# Patient Record
Sex: Female | Born: 1955 | Hispanic: Yes | Smoking: Never smoker
Health system: Southern US, Community
[De-identification: ages and names within clinical notes are randomized; demographics above are authoritative.]

---

## 2019-03-15 ENCOUNTER — Encounter (HOSPITAL_COMMUNITY): Payer: Self-pay | Admitting: Emergency Medicine

## 2019-03-15 ENCOUNTER — Emergency Department (HOSPITAL_COMMUNITY)
Admission: EM | Admit: 2019-03-15 | Discharge: 2019-03-15 | Disposition: A | Discharge status: Home / Self Care | Payer: BLUE CROSS/BLUE SHIELD | Attending: Emergency Medicine | Admitting: Emergency Medicine

## 2019-03-15 DIAGNOSIS — R220 Localized swelling, mass and lump, head: Secondary | ICD-10-CM | POA: Insufficient documentation

## 2019-03-15 MED ORDER — NAPROXEN 500 MG PO TABS: 500.0000 mg | ORAL_TABLET | Freq: Two times a day (BID) | ORAL | 0 refills | Status: DC

## 2019-03-15 MED ORDER — AMOXICILLIN-POT CLAVULANATE 875-125 MG PO TABS: 1.0000 | ORAL_TABLET | Freq: Two times a day (BID) | ORAL | 0 refills | Status: DC

## 2019-03-15 NOTE — ED Provider Notes (Signed)
[facility] EMERGENCY DEPARTMENT Provider Note   CSN: [id number] Arrival date & time: 03/15/19  0759    History   Chief Complaint Chief Complaint  Patient presents with  . Facial Swelling    HPI Ebony Davis is a 63 y.o. female.     63 year old female with prior medical history as detailed below presents for evaluation of swelling of the right cheek.  Patient has noticed the swelling over the last 24 hours.  She reports complains of tenderness just inferior to the right eye and just lateral to the nose.  She denies fever.  She does report some vague pain to the right upper jaw into the her dentition.  She denies recent history of dental abscess or known dental disease.  She has not seen a dentist in quite some time.  She is otherwise without complaint.  She denies fever, chest pain, shortness of breath, nausea, vomiting, or other acute complaint.  History obtained with assistance of Engineer, structural.  The history is provided by the patient and medical records. The history is limited by a language barrier. A language interpreter was used.  Illness  Location:  Swelling and pain of the right cheek. Severity:  Mild Onset quality:  Gradual Duration:  1 day Timing:  Constant Progression:  Worsening Chronicity:  New Associated symptoms: no fever     History reviewed. No pertinent past medical history.  There are no active problems to display for this patient.   OB History   No obstetric history on file.      Home Medications    Prior to Admission medications   Not on File    Family History No family history on file.  Social History Social History   Tobacco Use  . Smoking status: Not on file  Substance Use Topics  . Alcohol use: Not on file  . Drug use: Not on file     Allergies   Patient has no known allergies.   Review of Systems Review of Systems  Constitutional: Negative for fever.  All other systems reviewed and are  negative.    Physical Exam Updated Vital Signs BP (!) 162/86 (BP Location: Right Arm)   Pulse (!) 101   Temp 98.1 F (36.7 C) (Oral)   Resp 16   SpO2 97%   Physical Exam Vitals signs and nursing note reviewed.  Constitutional:      General: She is not in acute distress.    Appearance: She is well-developed.  HENT:     Head: Normocephalic and atraumatic.     Mouth/Throat:     Comments: Intraoral exam is without demonstration of dental abscess or significant dental pathology. Eyes:     Conjunctiva/sclera: Conjunctivae normal.     Pupils: Pupils are equal, round, and reactive to light.  Neck:     Musculoskeletal: Normal range of motion and neck supple.  Cardiovascular:     Rate and Rhythm: Normal rate and regular rhythm.     Heart sounds: Normal heart sounds.  Pulmonary:     Effort: Pulmonary effort is normal. No respiratory distress.     Breath sounds: Normal breath sounds.  Abdominal:     General: There is no distension.     Palpations: Abdomen is soft.     Tenderness: There is no abdominal tenderness.  Musculoskeletal: Normal range of motion.        General: No deformity.  Skin:    General: Skin is warm and dry.  Comments: Tenderness and edema noted to the right cheek just inferior to the right eye and just lateral to the right nare.  No fluctuance noted.  No erythema noted.    Neurological:     Mental Status: She is alert and oriented to person, place, and time.      ED Treatments / Results  Labs (all labs ordered are listed, but only abnormal results are displayed) Labs Reviewed - No data to display  EKG None  Radiology No results found.  Procedures Procedures (including critical care time)  Medications Ordered in ED Medications - No data to display   Initial Impression / Assessment and Plan / ED Course  I have reviewed the triage vital signs and the nursing notes.  Pertinent labs & imaging results that were available during my care of the  patient were reviewed by me and considered in my medical decision making (see chart for details).        MDM  Screen complete  Velta AddisonCecilia Davis was evaluated in Emergency Department on 03/15/2019 for the symptoms described in the history of present illness. She was evaluated in the context of the global COVID-19 pandemic, which necessitated consideration that the patient might be at risk for infection with the SARS-CoV-2 virus that causes COVID-19. Institutional protocols and algorithms that pertain to the evaluation of patients at risk for COVID-19 are in a state of rapid change based on information released by regulatory bodies including the CDC and federal and state organizations. These policies and algorithms were followed during the patient's care in the ED.  Patient is presenting for evaluation of mild swelling and tenderness to the right cheek.  This is most likely secondary to a dental infection.  She is otherwise without complaint and appears to be appropriate for discharge.  We will prescribe a course of antibiotics.  She does understand the need for close follow-up with dentistry.  Strict return precautions given and understood.  Final Clinical Impressions(s) / ED Diagnoses   Final diagnoses:  Facial swelling    ED Discharge Orders         Ordered    amoxicillin-clavulanate (AUGMENTIN) 875-125 MG tablet  Every 12 hours     03/15/19 0834           Wynetta FinesMessick, Azad Calame C, MD 03/15/19 [phone]

## 2019-03-15 NOTE — ED Triage Notes (Signed)
Pt reports pain and swelling in right side of face. Denies known injury, insect bite or new medicine or products. Pt states right side of face is painful and itching

## 2019-03-15 NOTE — ED Notes (Signed)
ED Provider at bedside. 

## 2019-03-15 NOTE — Discharge Instructions (Signed)
Please return for any problem. Follow up with your regular care provider as instructed.   Follow up with Dentistry as instructed.

## 2020-01-17 ENCOUNTER — Ambulatory Visit: Payer: Medicare Other | Attending: Internal Medicine

## 2020-01-17 DIAGNOSIS — Z23 Encounter for immunization: Secondary | ICD-10-CM

## 2020-01-17 NOTE — Progress Notes (Signed)
   Covid-19 Vaccination Clinic  Name:  Ebony Davis    MRN: [id number] DOB: Oct 28, 1956  01/17/2020  [name] was observed post Covid-19 immunization for 15 minutes without incident. She was provided with Vaccine Information Sheet and instruction to access the V-Safe system.   [name] was instructed to call 911 with any severe reactions post vaccine: Marland Kitchen Difficulty breathing  . Swelling of face and throat  . A fast heartbeat  . A bad rash all over body  . Dizziness and weakness   Immunizations Administered    Name Date Dose VIS Date Route   Pfizer COVID-19 Vaccine 01/17/2020  8:17 AM 0.3 mL 10/18/2019 Intramuscular   Manufacturer: [employer], [employer]   Lot: [id number]   NDC: [address]-8

## 2020-02-10 ENCOUNTER — Ambulatory Visit: Payer: Medicare Other

## 2020-02-10 ENCOUNTER — Ambulatory Visit: Payer: Medicare Other | Attending: Internal Medicine

## 2020-02-10 DIAGNOSIS — Z23 Encounter for immunization: Secondary | ICD-10-CM

## 2020-02-10 NOTE — Progress Notes (Signed)
   Covid-19 Vaccination Clinic  Name:  Ebony Davis    MRN: [id number] DOB: Jul 09, 1956  02/10/2020  Ebony Davis was observed post Covid-19 immunization for 15 minutes without incident. She was provided with Vaccine Information Sheet and instruction to access the V-Safe system.   Ebony Davis was instructed to call 911 with any severe reactions post vaccine: Marland Kitchen Difficulty breathing  . Swelling of face and throat  . A fast heartbeat  . A bad rash all over body  . Dizziness and weakness   Immunizations Administered    Name Date Dose VIS Date Route   Pfizer COVID-19 Vaccine 02/10/2020  3:25 PM 0.3 mL 10/18/2019 Intramuscular   Manufacturer: [employer], [employer]   Lot: [id number]   NDC: [address]-7

## 2020-11-24 ENCOUNTER — Emergency Department (HOSPITAL_COMMUNITY): Payer: Self-pay

## 2020-11-24 ENCOUNTER — Emergency Department (HOSPITAL_COMMUNITY)
Admission: EM | Admit: 2020-11-24 | Discharge: 2020-11-24 | Disposition: A | Discharge status: Home / Self Care | Payer: Self-pay | Attending: Emergency Medicine | Admitting: Emergency Medicine

## 2020-11-24 ENCOUNTER — Encounter (HOSPITAL_COMMUNITY): Payer: Self-pay

## 2020-11-24 ENCOUNTER — Other Ambulatory Visit: Payer: Self-pay

## 2020-11-24 DIAGNOSIS — S62101A Fracture of unspecified carpal bone, right wrist, initial encounter for closed fracture: Secondary | ICD-10-CM

## 2020-11-24 DIAGNOSIS — Y92009 Unspecified place in unspecified non-institutional (private) residence as the place of occurrence of the external cause: Secondary | ICD-10-CM | POA: Insufficient documentation

## 2020-11-24 DIAGNOSIS — W009XXA Unspecified fall due to ice and snow, initial encounter: Secondary | ICD-10-CM | POA: Insufficient documentation

## 2020-11-24 DIAGNOSIS — S6291XA Unspecified fracture of right wrist and hand, initial encounter for closed fracture: Secondary | ICD-10-CM | POA: Insufficient documentation

## 2020-11-24 MED ORDER — HYDROCODONE-ACETAMINOPHEN 5-325 MG PO TABS: 1.0000 | ORAL_TABLET | Freq: Four times a day (QID) | ORAL | 0 refills | Status: DC | PRN

## 2020-11-24 MED ORDER — FENTANYL CITRATE (PF) 100 MCG/2ML IJ SOLN
50.0000 ug | Freq: Once | INTRAMUSCULAR | Status: AC
  Administered 2020-11-24: 50 ug via INTRAVENOUS
  Filled 2020-11-24: qty 2

## 2020-11-24 MED ORDER — OXYCODONE-ACETAMINOPHEN 5-325 MG PO TABS
1.0000 | ORAL_TABLET | Freq: Once | ORAL | Status: AC
  Administered 2020-11-24: 1 via ORAL
  Filled 2020-11-24: qty 1

## 2020-11-24 MED ORDER — LIDOCAINE HCL (PF) 1 % IJ SOLN
10.0000 mL | Freq: Once | INTRAMUSCULAR | Status: AC
  Administered 2020-11-24: 10 mL
  Filled 2020-11-24: qty 30

## 2020-11-24 NOTE — ED Notes (Signed)
Called spanish translation service and spoke with Alecia Lemming [phone]. Explained discharge paperwork. Pt says she does not have questions.

## 2020-11-24 NOTE — Discharge Instructions (Addendum)
At this time there does not appear to be the presence of an emergent medical condition, however there is always the potential for conditions to change. Please read and follow the below instructions.  Please return to the Emergency Department immediately for any new or worsening symptoms. Please be sure to follow up with your Primary Care Provider within one week regarding your visit today; please call their office to schedule an appointment even if you are feeling better for a follow-up visit. Please see Dr. Janee Morn the hand specialist this week for reassessment.  His office should call you today or tomorrow to schedule a follow-up appointment.  Please call his office tomorrow morning to schedule your appointment if you do not hear from them sooner.  You will need reevaluation with the hand specialist for further evaluation and definitive treatment. You may take the medication Norco (Hydrocodone/Acetaminophen) as prescribed to help with severe pain.  This medication will make you drowsy so do not drive, drink alcohol, take other sedating medications or perform any dangerous activities such as driving after taking Norco. Norco contains Tylenol (acetaminophen) so do not take any other Tylenol-containing products with Norco. Please use rest ice elevation to help with pain and swelling.  Go to the nearest Emergency Department immediately if: You have fever or chills Your skin or fingers on your injured arm turn blue or gray. Your arm feels cold or gets numb. You have very bad pain in your injured wrist. You have headache You have chest pain or trouble breathing You have abdominal pain, nausea or vomiting. You have any new/concerning or worsening of symptoms   Please read the additional information packets attached to your discharge summary.  Do not take your medicine if  develop an itchy rash, swelling in your mouth or lips, or difficulty breathing; call 911 and seek immediate emergency medical  attention if this occurs.  You may review your lab tests and imaging results in their entirety on your MyChart account.  Please discuss all results of fully with your primary care provider and other specialist at your follow-up visit.  Note: Portions of this text may have been transcribed using voice recognition software. Every effort was made to ensure accuracy; however, inadvertent computerized transcription errors may still be present. =============== Below has been translated using Google translate.  Errors may be present.  Interpret with caution.  A continuacin se ha traducido con Soil scientist. Puede haber errores. Interprete con precaucin. =============== En este momento no parece haber presencia de una condicin mdica emergente, sin embargo, siempre existe la posibilidad de que las condiciones [place]. Lea y siga las instrucciones a continuacin.  1. Regrese al Departamento de Emergencias de inmediato si presenta sntomas nuevos o que empeoran. 2. Asegrese de hacer un seguimiento con su proveedor de atencin primaria dentro de una semana con respecto a su visita de hoy; llame a su oficina para programar una cita, incluso si se siente mejor para una visita de seguimiento. 3. Consulte al Dr. Janee Morn, el especialista en manos, esta semana para una nueva evaluacin. Su oficina debe llamarlo hoy o maana para programar una cita de seguimiento. Llame a su oficina maana por la maana para programar su cita si no tiene noticias de ellos antes. Necesitar una reevaluacin con el especialista en manos para una evaluacin adicional y un tratamiento definitivo. 4. Puede tomar el medicamento Norco (hidrocodona/acetaminofeno) segn lo prescrito para Engineer, materials intenso. Este medicamento lo adormecer, as que no conduzca, beba alcohol, tome otros  medicamentos sedantes ni realice actividades peligrosas, como conducir, despus de tomar Norco. Norco contiene Tylenol (acetaminofeno), as que  no tome ningn otro producto que contenga Tylenol con Norco. 5. Utilice la elevacin del hielo en reposo para ayudar con el dolor y la hinchazn.  Vaya al Departamento de Emergencias ms cercano de inmediato si: ? Tienes fiebre o escalofros ? La piel o los dedos del brazo lesionado se vuelven azules o grises. ? Su brazo se siente fro o se entumece. ? Tiene un dolor muy fuerte en la [place]. ? Tienes dolor de cabeza ? Tiene dolor en el pecho o dificultad para respirar ? Tiene dolor abdominal, nuseas o vmitos. ? Tiene algn sntoma nuevo/preocupante o empeoramiento de los sntomas   Lea los paquetes de informacin adicional adjuntos a su resumen de alta.  No tome su medicamento si presenta sarpullido con picazn, hinchazn en la boca o los labios, o dificultad para respirar; llame al 911 y busque atencin mdica de emergencia inmediata si esto ocurre.  Puede revisar sus pruebas de laboratorio y [place] de imgenes en su totalidad en su cuenta MyChart. Analice todos los resultados de forma completa con su proveedor de atencin primaria y otro especialista en su visita de seguimiento.  Nota: Es posible que partes de este texto se hayan transcrito con un software de Network engineer de voz. Se hizo todo lo posible para garantizar la precisin; sin embargo, an pueden estar presentes errores de transcripcin computarizados involuntarios.

## 2020-11-24 NOTE — Progress Notes (Signed)
Orthopedic Tech Progress Note Patient Details:  Ebony Davis 11-Nov-1955 [id number]  Ortho Devices Type of Ortho Device: Finger trap       Saul Fordyce 11/24/2020, 2:02 PM

## 2020-11-24 NOTE — Progress Notes (Signed)
Orthopedic Tech Progress Note Patient Details:  Ebony Davis 1956/05/06 [id number]  Ortho Devices Type of Ortho Device: Ace wrap,Rad Gutter splint Finger Trap Weight: right Ortho Device/Splint Interventions: Application   Post Interventions Patient Tolerated: Well Instructions Provided: Care of device   Saul Fordyce 11/24/2020, 3:05 PM

## 2020-11-24 NOTE — Progress Notes (Signed)
Orthopedic Tech Progress Note Patient Details:  Ebony Davis 08-27-1956 [id number]  Ortho Devices Type of Ortho Device: Shoulder immobilizer Finger Trap Weight: right Ortho Device/Splint Interventions: Application   Post Interventions Patient Tolerated: Well Instructions Provided: Care of device   Saul Fordyce 11/24/2020, 3:37 PM

## 2020-11-24 NOTE — ED Triage Notes (Signed)
Interpreter [id number]; [place] used for triage assessment.   Pt reports a fall at home in the ice as she was starting the car. C/o right wrist pain. Good pulses.

## 2020-11-24 NOTE — ED Provider Notes (Signed)
[facility]-EMERGENCY DEPT Provider Note   CSN: [id number] Arrival date & time: 11/24/20  5956     History Chief Complaint  Patient presents with  . Fall   Spanish video interpreter used throughout visit. Ebony Davis is a 65 y.o. female patient reports she slipped on ice this morning falling forward onto her right wrist.  She reports immediate pain severe constant throbbing nonradiating worsened with movement and palpation improved with rest.  She reports associated swelling of the area.  Denies head injury, loss consciousness, blood thinner use, headache, neck pain, back pain, chest pain, abdominal pain, left upper extremity pain, lower extremity pain, numbness/weakness, tingling or any additional concerns  HPI     History reviewed. No pertinent past medical history.  There are no problems to display for this patient.   History reviewed. No pertinent surgical history.   OB History   No obstetric history on file.     No family history on file.  Social History   Tobacco Use  . Smoking status: Never Smoker  . Smokeless tobacco: Never Used  Substance Use Topics  . Alcohol use: Never  . Drug use: Never    Home Medications Prior to Admission medications   Medication Sig Start Date End Date Taking? Authorizing Provider  HYDROcodone-acetaminophen (NORCO/VICODIN) 5-325 MG tablet Take 1 tablet by mouth every 6 (six) hours as needed for severe pain. 11/24/20  Yes Harlene Salts A, PA-C  amoxicillin-clavulanate (AUGMENTIN) 875-125 MG tablet Take 1 tablet by mouth every 12 (twelve) hours. 03/15/19   Wynetta Fines, MD  naproxen (NAPROSYN) 500 MG tablet Take 1 tablet (500 mg total) by mouth 2 (two) times daily. 03/15/19   Wynetta Fines, MD    Allergies    Patient has no known allergies.  Review of Systems   Review of Systems Ten systems are reviewed and are negative for acute change except as noted in the HPI  Physical Exam Updated Vital  Signs BP (!) 150/83 (BP Location: Left Arm)   Pulse 82   Temp 99.6 F (37.6 C) (Oral)   Resp 16   Ht 5' 1.02" (1.55 m)   Wt 72.6 kg   SpO2 97%   BMI 30.21 kg/m   Physical Exam Constitutional:      General: She is not in acute distress.    Appearance: Normal appearance. She is well-developed. She is not ill-appearing or diaphoretic.  HENT:     Head: Normocephalic and atraumatic. No raccoon eyes or Battle's sign.     Jaw: There is normal jaw occlusion. No trismus.     Right Ear: External ear normal. No hemotympanum.     Left Ear: External ear normal. No hemotympanum.     Nose: Nose normal.     Right Nostril: No epistaxis.     Left Nostril: No epistaxis.     Mouth/Throat:     Mouth: Mucous membranes are moist.     Pharynx: Oropharynx is clear.     Comments: No evidence of dental injury Eyes:     General: Vision grossly intact. Gaze aligned appropriately.     Extraocular Movements: Extraocular movements intact.     Conjunctiva/sclera: Conjunctivae normal.     Pupils: Pupils are equal, round, and reactive to light.  Neck:     Trachea: Trachea and phonation normal. No tracheal tenderness or tracheal deviation.  Cardiovascular:     Rate and Rhythm: Normal rate and regular rhythm.     Pulses:  Radial pulses are 2+ on the right side and 2+ on the left side.  Pulmonary:     Effort: Pulmonary effort is normal. No respiratory distress.     Breath sounds: Normal breath sounds and air entry.  Abdominal:     General: There is no distension.     Palpations: Abdomen is soft.     Tenderness: There is no abdominal tenderness. There is no guarding or rebound.  Musculoskeletal:        General: Normal range of motion.     Cervical back: Normal range of motion and neck supple. No spinous process tenderness or muscular tenderness.     Comments: No midline C/T/L spinal tenderness to palpation, no paraspinal muscle tenderness, no deformity, crepitus, or step-off noted.  Pelvis stable  compression bilaterally without pain.  Full range of motion appropriate strength of all major joints of the bilateral lower extremities and left upper extremity without pain. - Full range of motion at the right shoulder and elbow without pain or deformity. - Right hand: Swelling and deformity noticed at the right wrist without tenting.  Small area of erythema present at the lower palm without skin break.  Skin is intact.  Fingers appear normal.  Tenderness over the wrist diffusely.  Flexion and extension at all finger joints intact but limited secondary to pain.  Sensation and capillary refill intact to all fingers.  Strong and equal radial pulses.  Compartments soft.    Skin:    General: Skin is warm and dry.  Neurological:     Mental Status: She is alert.     GCS: GCS eye subscore is 4. GCS verbal subscore is 5. GCS motor subscore is 6.     Comments: Speech is clear and goal oriented, follows commands Major Cranial nerves without deficit, no facial droop Moves extremities without ataxia, coordination intact  Psychiatric:        Behavior: Behavior normal.     ED Results / Procedures / Treatments   Labs (all labs ordered are listed, but only abnormal results are displayed) Labs Reviewed - No data to display  EKG None  Radiology DG Wrist Complete Right  Result Date: 11/24/2020 CLINICAL DATA:  Status post reduction EXAM: RIGHT WRIST - COMPLETE 3+ VIEW COMPARISON:  Films from earlier in the same day. FINDINGS: Splinting material is now in place. The fracture has been reduced somewhat although some posterior displacement of the distal radial fracture fragment remains. No new fracture is identified. IMPRESSION: Slight reduction of the distal radial fracture. Electronically Signed   By: Alcide Clever M.D.   On: 11/24/2020 15:08   DG Wrist Complete Right  Result Date: 11/24/2020 CLINICAL DATA:  Fall. EXAM: RIGHT WRIST - COMPLETE 3+ VIEW COMPARISON:  No prior. FINDINGS: Comminuted severely  displaced fracture of the distal radius noted. Fracture extension into the radiocarpal joint space cannot be excluded. Distal ulna intact. IMPRESSION: Comminuted severely displaced fracture of the distal radius. Fracture extension into the radiocarpal joint space cannot be excluded. Electronically Signed   By: Maisie Fus  Register   On: 11/24/2020 07:22    Procedures .Ortho Injury Treatment  Date/Time: 11/24/2020 3:26 PM Performed by: Bill Salinas, PA-C Authorized by: Bill Salinas, PA-C   Consent:    Consent obtained:  Verbal   Consent given by:  Patient   Risks discussed:  Fracture, irreducible dislocation, nerve damage, recurrent dislocation, restricted joint movement, stiffness and vascular damageInjury location: wrist Location details: right wrist Injury type: fracture-dislocation Pre-procedure neurovascular  assessment: neurovascularly intact Pre-procedure distal perfusion: normal Pre-procedure neurological function: normal Pre-procedure range of motion: reduced Anesthesia: hematoma block  Anesthesia: Local anesthesia used: yes Local Anesthetic: lidocaine 1% without epinephrine  Patient sedated: NoManipulation performed: yes Skeletal traction used: yes Reduction successful: yes X-ray confirmed reduction: yes Immobilization: splint and sling Splint type: sugar tong Supplies used: cotton padding,  plaster and elastic bandage Post-procedure neurovascular assessment: post-procedure neurovascularly intact Post-procedure distal perfusion: normal Post-procedure neurological function: normal Post-procedure range of motion comment: Splinted Patient tolerance: patient tolerated the procedure well with no immediate complications Comments: Procedure supervised by Dr. Myrtis SerKatz    (including critical care time)  Medications Ordered in ED Medications  lidocaine (PF) (XYLOCAINE) 1 % injection 10 mL (10 mLs Infiltration Given 11/24/20 1437)  oxyCODONE-acetaminophen  (PERCOCET/ROXICET) 5-325 MG per tablet 1 tablet (1 tablet Oral Given 11/24/20 1346)  fentaNYL (SUBLIMAZE) injection 50 mcg (50 mcg Intravenous Given 11/24/20 1436)    ED Course  I have reviewed the triage vital signs and the nursing notes.  Pertinent labs & imaging results that were available during my care of the patient were reviewed by me and considered in my medical decision making (see chart for details).  Clinical Course as of 11/24/20 1538  Tue Nov 24, 2020  1325 Margaretha GlassingJeffery PA-C [BM]  [id number] Dr. Janee Mornhompson [BM]    Clinical Course User Index [BM] Elizabeth PalauMorelli, Yakub Lodes A, PA-C   MDM Rules/Calculators/A&P                         Additional history obtained from: 1. Nursing notes from this visit. ---------------- [age] year old female presented after mechanical fall slipping on the ice today falling directly onto her right wrist suffering immediate pain of the area.  She denies any head injury, loss conscious, blood thinner use and no pain to the head neck back chest abdomen or other extremities.  She is good range of motion and strength at the shoulder and elbow without pain.  This appears to be an isolated injury of the right wrist.  She is neurovascularly intact good sensation and capillary refill to all fingers, strong equal radial pulse.  Compartments are soft.  X-ray obtained in triage shows fracture/dislocation as below.  DG Right Wrist:  IMPRESSION:  Comminuted severely displaced fracture of the distal radius.  Fracture extension into the radiocarpal joint space cannot be  excluded.  --------- I consulted a hand specialist spoke with Josie DixonJeffrey PA-C who agrees with current plan of hematoma block and reduction in the ER.  Will obtain postreduction x-rays.  Spanish video interpreter was used for consent and throughout procedure today.  As above hematoma block was performed under supervision of Dr. Myrtis SerKatz and fracture/dislocation was reduced and splinted with help of orthopedic technician.  Post  reduction films were obtained and reviewed below.  Patient neurovascular intact post procedure reports splint is comfortable.  Patient given sling.  DG Right Wrist:    IMPRESSION:  Slight reduction of the distal radial fracture.  - 1:27 PM: Consult with hand surgeon Dr. Janee Mornhompson who has reviewed patient's x-rays and agrees with discharge and plan to have patient follow-up in his office this week. - Patient reassessed and is resting comfortably no acute distress sling and splint remain comfortable.  Remains neurovascular intact.  Patient vies of narcotic precautions and states understanding, 6 pills (six) Norco was sent to her pharmacy.  Discussed rice therapy and OTC anti-inflammatories.  She is aware to follow-up with hand specialist this week.  No indication further work-up at this time.  At this time there does not appear to be any evidence of an acute emergency medical condition and the patient appears stable for discharge with appropriate outpatient follow up. Diagnosis was discussed with patient who verbalizes understanding of care plan and is agreeable to discharge. I have discussed return precautions with patient who verbalizes understanding. Patient encouraged to follow-up with their PCP and hand specialist. All questions answered.  Patient seen and evaluated by Dr. Myrtis Ser during this visit who agrees with work-up and discharge  Spanish video interpreter used throughout this visit.  Note: Portions of this report may have been transcribed using voice recognition software. Every effort was made to ensure accuracy; however, inadvertent computerized transcription errors may still be present.= Final Clinical Impression(s) / ED Diagnoses Final diagnoses:  Closed fracture of right wrist, initial encounter    Rx / DC Orders ED Discharge Orders         Ordered    HYDROcodone-acetaminophen (NORCO/VICODIN) 5-325 MG tablet  Every 6 hours PRN        11/24/20 1535           Elizabeth Palau 11/24/20 1538    Sabino Donovan, MD 11/25/20 [phone]

## 2020-11-25 ENCOUNTER — Other Ambulatory Visit: Payer: Self-pay | Admitting: Orthopedic Surgery

## 2020-11-26 ENCOUNTER — Other Ambulatory Visit (HOSPITAL_COMMUNITY)
Admission: RE | Admit: 2020-11-26 | Discharge: 2020-11-26 | Disposition: A | Discharge status: Home / Self Care | Payer: Self-pay | Source: Ambulatory Visit | Attending: Orthopedic Surgery | Admitting: Orthopedic Surgery

## 2020-11-26 DIAGNOSIS — Z20822 Contact with and (suspected) exposure to covid-19: Secondary | ICD-10-CM | POA: Insufficient documentation

## 2020-11-26 DIAGNOSIS — Z01812 Encounter for preprocedural laboratory examination: Secondary | ICD-10-CM | POA: Insufficient documentation

## 2020-11-26 LAB — SARS CORONAVIRUS 2 (TAT 6-24 HRS): SARS Coronavirus 2: NEGATIVE

## 2020-11-26 NOTE — H&P (Signed)
Primary Care Provider: Not noted Referring Provider: ED Worker's Comp: No Date of Injury or Onset: 11-24-20  History: CC / Reason for Visit: Right wrist injury HPI: This patient is a 65 year old RHD female who presents for evaluation of a right wrist injury that she sustained from a fall from ground height.  She was evaluated in emergency department, reduction maneuver performed and splinted.  She speaks predominantly Spanish and her daughter is present, who is bilingual.  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  Past medical history, past surgical history, family history, social history, medications, allergies and review of systems are thoroughly reviewed by me, signed and scanned into SRS today.    Exam:  Vitals: Refer to EMR. Constitutional:  WD, WN, NAD HEENT:  NCAT, EOMI Neuro/Psych:  Alert & oriented to person, place, and time; appropriate mood & affect Lymphatic: No generalized UE edema or lymphadenopathy Extremities / MSK:  Both UE are normal with respect to appearance, ranges of motion, joint stability, muscle strength/tone, sensation, & perfusion except as otherwise noted:  The right upper extremity has a very interesting coaptation splint that extends to the fingertips sparing the small finger.  Intact light touch sensibility in the radial, median, and ulnar nerve distributions with intact motor to the same.  No tenderness about the elbow  Labs / Xrays:  No radiographic studies obtained today.  Injury x-rays are reviewed, revealing a comminuted intra-articular distal radius fracture with significant dorsal translation and tilt, mildly improved post reduction  Assessment: Displaced comminuted right intra-articular distal radius fracture  Plan:  I discussed these findings with her and her daughter.  Plastic models were used.  Radiographs are reviewed.  The indications for operative treatment were reviewed, as well as general elements of recovery and rehab.   We will plan to proceed tomorrow at [facility] day.  The details of the operative procedure were discussed with the patient.  Questions were invited and answered.  In addition to the goal of the procedure, the risks of the procedure to include but not limited to bleeding; infection; damage to the nerves or blood vessels that could result in bleeding, numbness, weakness, chronic pain, and the need for additional procedures; stiffness; the need for revision surgery; and anesthetic risks were reviewed.  No specific outcome was guaranteed or implied.  Informed consent was obtained.  Work status: All interested parties should please consider this patient to be out of work entirely if no work is available that complies with the restrictions detailed below.  If these restrictions result in the patient being out of work entirely, the employer is expected to provide documentation of such to all interested third parties such as disability insurance companies: No work from date of injury 11-24-20 through first postop visit 10-15 days following surgery, which is presently planned for 11-27-20  Autoauthenticated,  Onalee Hua A. Janee Morn, MD  CC: None

## 2020-11-26 NOTE — Progress Notes (Signed)
UTR for preop phone call. Pt went for covid test after pre-op appointment 11/26/20. Results pending. Interpreter scheduled for 11/27/20

## 2020-11-27 ENCOUNTER — Ambulatory Visit (HOSPITAL_BASED_OUTPATIENT_CLINIC_OR_DEPARTMENT_OTHER): Payer: Self-pay | Admitting: Certified Registered Nurse Anesthetist

## 2020-11-27 ENCOUNTER — Ambulatory Visit (HOSPITAL_BASED_OUTPATIENT_CLINIC_OR_DEPARTMENT_OTHER)
Admission: RE | Admit: 2020-11-27 | Discharge: 2020-11-27 | Disposition: A | Discharge status: Home / Self Care | Payer: Self-pay | Attending: Orthopedic Surgery | Admitting: Orthopedic Surgery

## 2020-11-27 ENCOUNTER — Encounter (HOSPITAL_BASED_OUTPATIENT_CLINIC_OR_DEPARTMENT_OTHER)
Admission: RE | Disposition: A | Discharge status: Home / Self Care | Payer: Self-pay | Source: Home / Self Care | Attending: Orthopedic Surgery

## 2020-11-27 ENCOUNTER — Encounter (HOSPITAL_BASED_OUTPATIENT_CLINIC_OR_DEPARTMENT_OTHER): Payer: Self-pay | Admitting: Orthopedic Surgery

## 2020-11-27 ENCOUNTER — Other Ambulatory Visit: Payer: Self-pay

## 2020-11-27 ENCOUNTER — Ambulatory Visit (HOSPITAL_COMMUNITY): Payer: Self-pay

## 2020-11-27 DIAGNOSIS — W1830XA Fall on same level, unspecified, initial encounter: Secondary | ICD-10-CM | POA: Insufficient documentation

## 2020-11-27 DIAGNOSIS — Z419 Encounter for procedure for purposes other than remedying health state, unspecified: Secondary | ICD-10-CM

## 2020-11-27 DIAGNOSIS — Y939 Activity, unspecified: Secondary | ICD-10-CM | POA: Insufficient documentation

## 2020-11-27 DIAGNOSIS — S52571A Other intraarticular fracture of lower end of right radius, initial encounter for closed fracture: Secondary | ICD-10-CM | POA: Insufficient documentation

## 2020-11-27 HISTORY — PX: OPEN REDUCTION INTERNAL FIXATION (ORIF) DISTAL RADIAL FRACTURE: SHX5989

## 2020-11-27 SURGERY — OPEN REDUCTION INTERNAL FIXATION (ORIF) DISTAL RADIUS FRACTURE
Anesthesia: Regional | Site: Wrist | Laterality: Right

## 2020-11-27 MED ORDER — ONDANSETRON HCL 4 MG/2ML IJ SOLN
INTRAMUSCULAR | Status: AC
  Filled 2020-11-27: qty 2

## 2020-11-27 MED ORDER — FENTANYL CITRATE (PF) 100 MCG/2ML IJ SOLN: 25.0000 ug | INTRAMUSCULAR | Status: DC | PRN

## 2020-11-27 MED ORDER — MIDAZOLAM HCL 2 MG/2ML IJ SOLN
INTRAMUSCULAR | Status: AC
  Filled 2020-11-27: qty 2

## 2020-11-27 MED ORDER — ACETAMINOPHEN 325 MG PO TABS: 650.0000 mg | ORAL_TABLET | Freq: Four times a day (QID) | ORAL | Status: DC

## 2020-11-27 MED ORDER — PROPOFOL 500 MG/50ML IV EMUL
INTRAVENOUS | Status: AC
  Filled 2020-11-27: qty 50

## 2020-11-27 MED ORDER — BUPIVACAINE-EPINEPHRINE (PF) 0.5% -1:200000 IJ SOLN
INTRAMUSCULAR | Status: DC | PRN
  Administered 2020-11-27: 30 mL via PERINEURAL

## 2020-11-27 MED ORDER — OXYCODONE HCL 5 MG/5ML PO SOLN: 5.0000 mg | Freq: Once | ORAL | Status: DC | PRN

## 2020-11-27 MED ORDER — MIDAZOLAM HCL 2 MG/2ML IJ SOLN
1.0000 mg | Freq: Once | INTRAMUSCULAR | Status: AC
  Administered 2020-11-27: 2 mg via INTRAVENOUS

## 2020-11-27 MED ORDER — FENTANYL CITRATE (PF) 100 MCG/2ML IJ SOLN
INTRAMUSCULAR | Status: AC
  Filled 2020-11-27: qty 2

## 2020-11-27 MED ORDER — OXYCODONE HCL 5 MG PO TABS: 5.0000 mg | ORAL_TABLET | Freq: Four times a day (QID) | ORAL | 0 refills | Status: AC | PRN

## 2020-11-27 MED ORDER — ONDANSETRON HCL 4 MG/2ML IJ SOLN
INTRAMUSCULAR | Status: DC | PRN
  Administered 2020-11-27: 4 mg via INTRAVENOUS

## 2020-11-27 MED ORDER — LACTATED RINGERS IV SOLN: INTRAVENOUS | Status: DC

## 2020-11-27 MED ORDER — FENTANYL CITRATE (PF) 100 MCG/2ML IJ SOLN
INTRAMUSCULAR | Status: DC | PRN
  Administered 2020-11-27 (×3): 25 ug via INTRAVENOUS

## 2020-11-27 MED ORDER — CEFAZOLIN SODIUM-DEXTROSE 2-4 GM/100ML-% IV SOLN
INTRAVENOUS | Status: AC
  Filled 2020-11-27: qty 100

## 2020-11-27 MED ORDER — IBUPROFEN 200 MG PO TABS: 600.0000 mg | ORAL_TABLET | Freq: Four times a day (QID) | ORAL | Status: DC

## 2020-11-27 MED ORDER — FENTANYL CITRATE (PF) 100 MCG/2ML IJ SOLN
50.0000 ug | Freq: Once | INTRAMUSCULAR | Status: AC
  Administered 2020-11-27: 50 ug via INTRAVENOUS

## 2020-11-27 MED ORDER — BUPIVACAINE HCL (PF) 0.25 % IJ SOLN
INTRAMUSCULAR | Status: AC
  Filled 2020-11-27: qty 30

## 2020-11-27 MED ORDER — CEFAZOLIN SODIUM-DEXTROSE 2-4 GM/100ML-% IV SOLN
2.0000 g | INTRAVENOUS | Status: AC
  Administered 2020-11-27: 2 g via INTRAVENOUS

## 2020-11-27 MED ORDER — OXYCODONE HCL 5 MG PO TABS: 5.0000 mg | ORAL_TABLET | Freq: Once | ORAL | Status: DC | PRN

## 2020-11-27 MED ORDER — ONDANSETRON HCL 4 MG/2ML IJ SOLN: 4.0000 mg | Freq: Once | INTRAMUSCULAR | Status: DC | PRN

## 2020-11-27 MED ORDER — PROPOFOL 500 MG/50ML IV EMUL
INTRAVENOUS | Status: DC | PRN
  Administered 2020-11-27: 120 ug/kg/min via INTRAVENOUS

## 2020-11-27 SURGICAL SUPPLY — 67 items
BAND RUBBER #18 3X1/16 STRL (MISCELLANEOUS) IMPLANT
BIT DRILL SOLID 2.0X40MM (BIT) IMPLANT
BIT DRILL SOLID 2.5X40MM (BIT) IMPLANT
BLADE HEX COATED 2.75 (ELECTRODE) ×2 IMPLANT
BLADE MINI RND TIP GREEN BEAV (BLADE) IMPLANT
BLADE SURG 15 STRL LF DISP TIS (BLADE) ×1 IMPLANT
BLADE SURG 15 STRL SS (BLADE) ×1
BNDG COHESIVE 2X5 TAN STRL LF (GAUZE/BANDAGES/DRESSINGS) IMPLANT
BNDG COHESIVE 4X5 TAN STRL (GAUZE/BANDAGES/DRESSINGS) ×2 IMPLANT
BNDG ESMARK 4X9 LF (GAUZE/BANDAGES/DRESSINGS) ×2 IMPLANT
BNDG GAUZE ELAST 4 BULKY (GAUZE/BANDAGES/DRESSINGS) ×2 IMPLANT
BRUSH SCRUB EZ PLAIN DRY (MISCELLANEOUS) ×2 IMPLANT
CANISTER SUCT 1200ML W/VALVE (MISCELLANEOUS) ×2 IMPLANT
CHLORAPREP W/TINT 26 (MISCELLANEOUS) ×2 IMPLANT
CORD BIPOLAR FORCEPS 12FT (ELECTRODE) ×2 IMPLANT
COVER BACK TABLE 60X90IN (DRAPES) ×2 IMPLANT
COVER MAYO STAND STRL (DRAPES) ×4 IMPLANT
COVER WAND RF STERILE (DRAPES) IMPLANT
CUFF TOURN SGL QUICK 18X4 (TOURNIQUET CUFF) ×2 IMPLANT
CUFF TOURN SGL QUICK 24 (TOURNIQUET CUFF)
CUFF TRNQT CYL 24X4X16.5-23 (TOURNIQUET CUFF) IMPLANT
DRAPE C-ARM 42X72 X-RAY (DRAPES) ×2 IMPLANT
DRAPE EXTREMITY T 121X128X90 (DISPOSABLE) ×2 IMPLANT
DRAPE SURG 17X23 STRL (DRAPES) ×2 IMPLANT
DRILL SOLID 2.0X40MM (BIT)
DRILL SOLID 2.5X40MM (BIT)
DRSG ADAPTIC 3X8 NADH LF (GAUZE/BANDAGES/DRESSINGS) ×2 IMPLANT
DRSG EMULSION OIL 3X3 NADH (GAUZE/BANDAGES/DRESSINGS) IMPLANT
Driver square tip 2.0mm ×4 IMPLANT
ELECT REM PT RETURN 9FT ADLT (ELECTROSURGICAL) ×2
ELECTRODE REM PT RTRN 9FT ADLT (ELECTROSURGICAL) ×1 IMPLANT
GAUZE SPONGE 4X4 12PLY STRL LF (GAUZE/BANDAGES/DRESSINGS) ×2 IMPLANT
GLOVE BIO SURGEON STRL SZ7.5 (GLOVE) ×2 IMPLANT
GLOVE ECLIPSE 6.5 STRL STRAW (GLOVE) ×2 IMPLANT
GLOVE SRG 8 PF TXTR STRL LF DI (GLOVE) ×1 IMPLANT
GLOVE SURG UNDER POLY LF SZ7 (GLOVE) ×2 IMPLANT
GLOVE SURG UNDER POLY LF SZ8 (GLOVE) ×1
GOWN STRL REUS W/ TWL LRG LVL3 (GOWN DISPOSABLE) ×2 IMPLANT
GOWN STRL REUS W/TWL LRG LVL3 (GOWN DISPOSABLE) ×2
GOWN STRL REUS W/TWL XL LVL3 (GOWN DISPOSABLE) ×2 IMPLANT
NEEDLE HYPO 25X1 1.5 SAFETY (NEEDLE) IMPLANT
NS IRRIG 1000ML POUR BTL (IV SOLUTION) ×2 IMPLANT
PACK BASIN DAY SURGERY FS (CUSTOM PROCEDURE TRAY) ×2 IMPLANT
PADDING CAST ABS 4INX4YD NS (CAST SUPPLIES)
PADDING CAST ABS COTTON 4X4 ST (CAST SUPPLIES) IMPLANT
PENCIL SMOKE EVACUATOR (MISCELLANEOUS) ×2 IMPLANT
SCREW GEMINUS PALS 2.5X12 (Screw) ×2 IMPLANT
SKELETAL DYNAMICS DVR SET (Set) ×2 IMPLANT
SLEEVE SCD COMPRESS KNEE MED (MISCELLANEOUS) ×2 IMPLANT
SLING ARM FOAM STRAP LRG (SOFTGOODS) IMPLANT
SLING ARM FOAM STRAP MED (SOFTGOODS) ×2 IMPLANT
SPLINT PLASTER CAST XFAST 3X15 (CAST SUPPLIES) IMPLANT
SPLINT PLASTER XTRA FASTSET 3X (CAST SUPPLIES)
SPLINT UNIVERSAL RIGHT (SOFTGOODS) ×2 IMPLANT
STOCKINETTE 6  STRL (DRAPES) ×1
STOCKINETTE 6 STRL (DRAPES) ×1 IMPLANT
SUCTION FRAZIER HANDLE 10FR (MISCELLANEOUS) ×1
SUCTION TUBE FRAZIER 10FR DISP (MISCELLANEOUS) ×1 IMPLANT
SUT VIC AB 2-0 PS2 27 (SUTURE) ×2 IMPLANT
SUT VICRYL 4-0 PS2 18IN ABS (SUTURE) IMPLANT
SUT VICRYL RAPIDE 4-0 (SUTURE) IMPLANT
SUT VICRYL RAPIDE 4/0 PS 2 (SUTURE) ×2 IMPLANT
SYR 10ML LL (SYRINGE) IMPLANT
SYR BULB EAR ULCER 3OZ GRN STR (SYRINGE) ×2 IMPLANT
TOWEL GREEN STERILE FF (TOWEL DISPOSABLE) ×2 IMPLANT
TUBE CONNECTING 20X1/4 (TUBING) ×2 IMPLANT
UNDERPAD 30X36 HEAVY ABSORB (UNDERPADS AND DIAPERS) ×2 IMPLANT

## 2020-11-27 NOTE — Op Note (Signed)
11/27/2020  1:33 PM  PATIENT:  Ebony Davis  65 y.o. female  PRE-OPERATIVE DIAGNOSIS:  Displaced right intra-articular distal radius fracture  POST-OPERATIVE DIAGNOSIS:  Same  PROCEDURE:  ORIF Right intra-articular distal radius fracture, 72620  SURGEON: Rayvon Char. Grandville Silos, MD  PHYSICIAN ASSISTANT: Morley Kos, OPA-C  ANESTHESIA:  regional and MAC  SPECIMENS:  None  DRAINS: None  EBL:  less than 50 mL  PREOPERATIVE INDICATIONS:  [name] is a  65 y.o. female with a displaced right distal radius fx  The risks benefits and alternatives were discussed with the patient preoperatively including but not limited to the risks of infection, bleeding, nerve injury, cardiopulmonary complications, the need for revision surgery, among others, and the patient verbalized understanding and consented to proceed.  OPERATIVE IMPLANTS: [employer] Geminus plate/screws/pegs  OPERATIVE PROCEDURE: After receiving prophylactic antibiotics and a regional block, the patient was escorted to the operative theatre and placed in a supine position.   A surgical "time-out" was performed during which the planned procedure, proposed operative site, and the correct patient identity were compared to the operative consent and agreement confirmed by the circulating nurse according to current facility policy. Following application of a tourniquet to the operative extremity, the exposed skin was pre-scrubbed with Hibiclens scrub brush and then was prepped with Chloraprep and draped in the usual sterile fashion. The limb was exsanguinated with an Esmarch bandage and the tourniquet inflated to approximately 174mHg higher than systolic BP.   A sinusoidal-shaped incision was marked and made over the FCR axis and the distal forearm. The skin was incised sharply with scalpel, subcutaneous tissues with blunt and spreading dissection. The FCR axis was exploited deeply. The pronator quadratus was  reflected in an L-shaped ulnarly for later reapproximation. The fracture was inspected and provisionally reduced.  This was confirmed fluoroscopically. The appropriately sized plate was selected and found to fit well. It was placed in its provisional alignment of the radius and this was confirmed fluoroscopically.  It was secured to the radius with a screw through the slotted hole.  Additional adjustments were made as necessary, and the distal holes were all drilled and filled.  Peg/screw length distally was selected on the shorter side of measurements to minimize the risk for dorsal cortical penetration. The remainder of the proximal holes were drilled and filled.   Final images were obtained and the DRUJ was examined for stability. It was found to be sufficiently stable. The wound was then copiously irrigated and the PQ was repaired with 2-0 Vicryl Rapide suture. Tourniquet was released and additional hemostasis obtained and the skin was closed with 2-0 Vicryl deep dermal buried sutures followed by running 4-0 Vicryl Rapide horizontal mattress suture in the skin. A bulky dressing with a removeable velcro wrist splint was applied and the patient was taken to the recovery room in stable condition.  DISPOSITION: The patient will be discharged home today with typical post-op instructions, returning in 10-15 days for reevaluation with new x-rays of the affected wrist out of the splint to include an inclined lateral and then can start rehab with [facility] therapy.

## 2020-11-27 NOTE — Transfer of Care (Signed)
Immediate Anesthesia Transfer of Care Note  Patient: Ebony Davis  Procedure(s) Performed: OPEN TREATMENT RIGHT DISTAL RADIUS FRACTURE (Right )  Patient Location: PACU  Anesthesia Type:MAC  Level of Consciousness: awake and patient cooperative  Airway & Oxygen Therapy: Patient Spontanous Breathing and Patient connected to face mask  Post-op Assessment: Report given to RN and Post -op Vital signs reviewed and stable  Post vital signs: Reviewed and stable  Last Vitals:  Vitals Value Taken Time  BP 113/65 11/27/20 1445  Temp    Pulse 55 11/27/20 1447  Resp 15 11/27/20 1447  SpO2 100 % 11/27/20 1447  Vitals shown include unvalidated device data.  Last Pain:  Vitals:   11/27/20 1106  TempSrc: Oral  PainSc: 0-No pain         Complications: No complications documented.

## 2020-11-27 NOTE — Progress Notes (Signed)
Assisted Dr. Brock with right, ultrasound guided, axillary block. Side rails up, monitors on throughout procedure. See vital signs in flow sheet. Tolerated Procedure well. 

## 2020-11-27 NOTE — Anesthesia Postprocedure Evaluation (Signed)
Anesthesia Post Note  Patient: Ebony Davis  Procedure(s) Performed: OPEN TREATMENT RIGHT DISTAL RADIUS FRACTURE (Right Wrist)     Patient location during evaluation: PACU Anesthesia Type: Regional Level of consciousness: awake and alert Pain management: pain level controlled Vital Signs Assessment: post-procedure vital signs reviewed and stable Respiratory status: spontaneous breathing, nonlabored ventilation and respiratory function stable Cardiovascular status: stable and blood pressure returned to baseline Anesthetic complications: no   No complications documented.  Last Vitals:  Vitals:   11/27/20 1220 11/27/20 1445  BP: 126/69 113/65  Pulse: 64 60  Resp: 13 18  Temp:    SpO2: 100% 100%    Last Pain:  Vitals:   11/27/20 1445  TempSrc:   PainSc: 0-No pain                 Beryle Lathe

## 2020-11-27 NOTE — Discharge Instructions (Signed)
Post Anesthesia Home Care Instructions  Activity: Get plenty of rest for the remainder of the day. A responsible individual must stay with you for 24 hours following the procedure.  For the next 24 hours, DO NOT: -Drive a car -Advertising copywriter -Drink alcoholic beverages -Take any medication unless instructed by your physician -Make any legal decisions or sign important papers.  Meals: Start with liquid foods such as gelatin or soup. Progress to regular foods as tolerated. Avoid greasy, spicy, heavy foods. If nausea and/or vomiting occur, drink only clear liquids until the nausea and/or vomiting subsides. Call your physician if vomiting continues.  Special Instructions/Symptoms: Your throat may feel dry or sore from the anesthesia or the breathing tube placed in your throat during surgery. If this causes discomfort, gargle with warm salt water. The discomfort should disappear within 24 hours.  If you had a scopolamine patch placed behind your ear for the management of post- operative nausea and/or vomiting:  1. The medication in the patch is effective for 72 hours, after which it should be removed.  Wrap patch in a tissue and discard in the trash. Wash hands thoroughly with soap and water. 2. You may remove the patch earlier than 72 hours if you experience unpleasant side effects which may include dry mouth, dizziness or visual disturbances. 3. Avoid touching the patch. Wash your hands with soap and water after contact with the patch.      Regional Anesthesia Blocks  1. Numbness or the inability to move the "blocked" extremity may last from 3-48 hours after placement. The length of time depends on the medication injected and your individual response to the medication. If the numbness is not going away after 48 hours, call your surgeon.  2. The extremity that is blocked will need to be protected until the numbness is gone and the  Strength has returned. Because you cannot feel it, you  will need to take extra care to avoid injury. Because it may be weak, you may have difficulty moving it or using it. You may not know what position it is in without looking at it while the block is in effect.  3. For blocks in the legs and feet, returning to weight bearing and walking needs to be done carefully. You will need to wait until the numbness is entirely gone and the strength has returned. You should be able to move your leg and foot normally before you try and bear weight or walk. You will need someone to be with you when you first try to ensure you do not fall and possibly risk injury.  4. Bruising and tenderness at the needle site are common side effects and will resolve in a few days.  5. Persistent numbness or new problems with movement should be communicated to the surgeon or the [facility] [phone] [facility] [phone]).   Discharge Instructions   You have a dressing with a plaster splint incorporated in it. Move your fingers as much as possible, making a full fist and fully opening the fist. Elevate your hand to reduce pain & swelling of the digits.  Ice over the operative site may be helpful to reduce pain & swelling.  DO NOT USE HEAT. Pain medicine has been prescribed for you.  Take Naproxen daily as stated on the bottle. You may increase to 2 tablets in the morning and 2 in the evening if needed. Additionally, take Tylenol 650 mg every 6 hours. Take Oxycodone only as  a rescue medicine for severe post operative pain. Leave the dressing in place until you return to our office.  You may shower, but keep the bandage clean & dry.  You may drive a car when you are off of prescription pain medications and can safely control your vehicle with both hands. Our office will call you to arrange follow-up   Please call [phone] during normal business hours or [phone] after hours for any problems. Including the following:  - excessive  redness of the incisions - drainage for more than 4 days - fever of more than 101.5 F  *Please note that pain medications will not be refilled after hours or on weekends.  WORK STATUS: Out of work until 1st post operative appointment. No lifting, gripping or grasping greater than pencil and paper tasks.

## 2020-11-27 NOTE — Anesthesia Procedure Notes (Addendum)
Anesthesia Regional Block: Axillary brachial plexus block   Pre-Anesthetic Checklist: ,, timeout performed, Correct Patient, Correct Site, Correct Laterality, Correct Procedure, Correct Position, site marked, Risks and benefits discussed,  Surgical consent,  Pre-op evaluation,  At surgeon's request and post-op pain management  Laterality: Right  Prep: chloraprep       Needles:  Injection technique: Single-shot  Needle Type: Echogenic Needle     Needle Length: 5cm  Needle Gauge: 21     Additional Needles:   Narrative:  Start time: 11/27/2020 12:15 PM End time: 11/27/2020 12:18 PM Injection made incrementally with aspirations every 5 mL.  Performed by: Personally  Anesthesiologist: Beryle Lathe, MD  Additional Notes: No pain on injection. No increased resistance to injection. Injection made in 5cc increments. Good needle visualization. Patient tolerated the procedure well.

## 2020-11-27 NOTE — Interval H&P Note (Signed)
History and Physical Interval Note:  11/27/2020 1:32 PM  Ebony Davis  has presented today for surgery, with the diagnosis of RIGHT DISTAL RADIUS FRACTURE.  The various methods of treatment have been discussed with the patient and family. After consideration of risks, benefits and other options for treatment, the patient has consented to  Procedure(s) with comments: OPEN TREATMENT RIGHT DISTAL RADIUS FRACTURE (Right) - LENGTH OF SURGERY: 75 MIN  PRE OP BLOCK as a surgical intervention.  The patient's history has been reviewed, patient examined, no change in status, stable for surgery.  I have reviewed the patient's chart and labs.  Questions were answered to the patient's satisfaction.     Jodi Marble

## 2020-11-27 NOTE — Anesthesia Preprocedure Evaluation (Addendum)
Anesthesia Evaluation  Patient identified by MRN, date of birth, ID band Patient awake    Reviewed: Allergy & Precautions, NPO status , Patient's Chart, lab work & pertinent test results  History of Anesthesia Complications Negative for: history of anesthetic complications  Airway Mallampati: I  TM Distance: >3 FB Neck ROM: Full    Dental  (+) Dental Advisory Given, Teeth Intact   Pulmonary neg pulmonary ROS,    Pulmonary exam normal        Cardiovascular negative cardio ROS Normal cardiovascular exam     Neuro/Psych negative neurological ROS  negative psych ROS   GI/Hepatic negative GI ROS, Neg liver ROS,   Endo/Other   Obesity   Renal/GU negative Renal ROS     Musculoskeletal negative musculoskeletal ROS (+)   Abdominal   Peds  Hematology negative hematology ROS (+)   Anesthesia Other Findings Covid test negative   Reproductive/Obstetrics                            Anesthesia Physical Anesthesia Plan  ASA: II  Anesthesia Plan: Regional   Post-op Pain Management:    Induction: Intravenous  PONV Risk Score and Plan: 2 and Propofol infusion and Treatment may vary due to age or medical condition  Airway Management Planned: Natural Airway and Simple Face Mask  Additional Equipment: None  Intra-op Plan:   Post-operative Plan:   Informed Consent: I have reviewed the patients History and Physical, chart, labs and discussed the procedure including the risks, benefits and alternatives for the proposed anesthesia with the patient or authorized representative who has indicated his/her understanding and acceptance.     Interpreter used for [employer] Discussed with: CRNA and Anesthesiologist  Anesthesia Plan Comments:        Anesthesia Quick Evaluation

## 2020-11-30 ENCOUNTER — Encounter (HOSPITAL_BASED_OUTPATIENT_CLINIC_OR_DEPARTMENT_OTHER): Payer: Self-pay | Admitting: Orthopedic Surgery

## 2020-12-02 ENCOUNTER — Ambulatory Visit: Payer: Self-pay | Attending: Orthopedic Surgery | Admitting: Occupational Therapy

## 2020-12-02 ENCOUNTER — Other Ambulatory Visit: Payer: Self-pay

## 2020-12-02 DIAGNOSIS — M6281 Muscle weakness (generalized): Secondary | ICD-10-CM | POA: Insufficient documentation

## 2020-12-02 DIAGNOSIS — R600 Localized edema: Secondary | ICD-10-CM | POA: Insufficient documentation

## 2020-12-02 DIAGNOSIS — M25631 Stiffness of right wrist, not elsewhere classified: Secondary | ICD-10-CM | POA: Insufficient documentation

## 2020-12-02 DIAGNOSIS — R278 Other lack of coordination: Secondary | ICD-10-CM | POA: Insufficient documentation

## 2020-12-02 DIAGNOSIS — M25531 Pain in right wrist: Secondary | ICD-10-CM | POA: Insufficient documentation

## 2020-12-02 NOTE — Therapy (Signed)
[facility] [address] [place], [place], [address] Phone: [phone]   Fax:  [phone]  Occupational Therapy Evaluation  Patient Details  Name: Ebony Davis MRN: [id number] Date of Birth: 20-May-1956 Referring Provider (OT): Dr. Mack Hook   Encounter Date: 12/02/2020   OT End of Session - 12/02/20 0957    Visit Number 1    Number of Visits 10    Date for OT Re-Evaluation 02/14/21    Authorization Type Self pay (Pt only has MCR Part A until July)    OT Start Time 0800    OT Stop Time 0847    OT Time Calculation (min) 47 min    Activity Tolerance Patient tolerated treatment well    Behavior During Therapy [facility] for tasks assessed/performed           No past medical history on file.  Past Surgical History:  Procedure Laterality Date  . OPEN REDUCTION INTERNAL FIXATION (ORIF) DISTAL RADIAL FRACTURE Right 11/27/2020   Procedure: OPEN TREATMENT RIGHT DISTAL RADIUS FRACTURE;  Surgeon: Mack Hook, MD;  Location: [facility];  Service: Orthopedics;  Laterality: Right;  LENGTH OF SURGERY: 75 MIN  PRE OP BLOCK    There were no vitals filed for this visit.   Subjective Assessment - 12/02/20 0815    Patient is accompanied by: Family member   son   Pertinent History ORIF RT distal radius fx 11/27/20    Limitations per protocol    Currently in Pain? Yes    Pain Score 5     Pain Location Wrist   and forearm   Pain Orientation Right    Pain Type Acute pain;Surgical pain    Pain Onset 1 to 4 weeks ago    Pain Frequency Intermittent    Aggravating Factors  movement    Pain Relieving Factors elevation, support             [facility] OT Assessment - 12/02/20 0001      Assessment   Medical Diagnosis ORIF Rt distal radius fx    Referring Provider (OT) Dr. Mack Hook    Onset Date/Surgical Date 11/27/20    Hand Dominance Right    Next MD Visit 12/08/20    Prior Therapy none       Precautions   Precautions Other (comment)    Precaution Comments per protocol following wrist fx surgery    Required Braces or Orthoses Other Brace/Splint    Other Brace/Splint wrist brace (provided by MD office)      Balance Screen   Has the patient fallen in the past 6 months Yes    How many times? 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 Family assist w/ IADLS at this time      Mobility   Mobility Status Independent      Written Expression   Dominant Hand Right    Handwriting --   unable to write at  this time     Vision - History   Baseline Vision Wears glasses all the time      Observation/Other Assessments   Observations Spanish speaking. Pt arrived fully wrapped/protected with initial gauze wrapping from surgery and pre-fab wrist brace as well as sling      Sensation   Light Touch Appears Intact   slightly hypersensitive     Edema   Edema moderate Rt hand/forearm      ROM / Strength   AROM / PROM / Strength AROM      AROM   Overall AROM Comments LUE AROM WNL's. RUE: shoulder and elbow WFL's. Supination: 48*, pronation = 75*, wrist ext = 28*, flex = 40*, composite finger flex approx 85%, finger extension WFL's (pt has premorbid ring finger deformity)                    OT Treatments/Exercises (OP) - 12/02/20 0001      ADLs   ADL Comments Pt's gauze carefully removed and forearm/wrist/hand cleaned and dried. Pt instructed she no longer needs sling but to continue wearing pre-fab wrist brace b/t exercises and at night. Pt instructed in precautions and hand/forearm  hygiene.      Exercises   Exercises Wrist;Hand      Wrist Exercises   Other wrist exercises Pt issued A/ROM HEP for forearm sup/pron and wrist flex/ext      Hand Exercises   Other Hand Exercises Pt issued A/ROM HEP for full composite finger flex/ext and thumb flex/ext                 OT Education - 12/02/20 0832    Education Details A/ROM HEP for forearm, wrist, hand/thumb, precautions, hand hygiene, review of pre-fab wrist brace wear and care    Person(s) Educated Patient;Child(ren)    Methods Explanation;Demonstration;Verbal cues;Handout   son interprets   Comprehension Verbalized understanding;Returned demonstration            OT Short Term Goals - 12/02/20 1003      OT SHORT TERM GOAL #1   Title Pt independent with initial HEP    Time 5    Period Weeks    Status New      OT SHORT TERM GOAL #2   Title Pt to verbalize understanding with pain management strategies and edema management strategies    Time 5    Period Weeks    Status New      OT SHORT TERM GOAL #3   Title Pt to improve forearm supination and wrist extension by 10* or more    Baseline sup = 48*, wrist ext = 28*    Time 5    Period Weeks    Status New             OT Long Term Goals - 12/02/20 1008      OT LONG TERM GOAL #1   Title Independent with updated strengthening HEP    Time 10    Period Weeks    Status New      OT LONG TERM GOAL #2   Title Pt to return to using Rt hand as dominant hand for BADLS and light functional tasks (including writing, eating)    Time 10    Period Weeks    Status New      OT LONG TERM GOAL #3   Title Pt to demo Rt forearm and wrist ROM WFL's    Time 10    Period  Weeks    Status New      OT LONG TERM GOAL #4   Title Pt to demo 30 lbs or greater grip strength Rt hand to open jars/containers    Time 10    Period Weeks    Status New                 Plan - 12/02/20 0959    Clinical Impression Statement Pt is a 65 y.o. spanish speaking  female who presents to OPOT with her son (who interprets for her) s/p ORIF Rt distal radius fx on 11/27/20 from fall on recent snow/ice. Pt's referral is to begin therapy but has pre-fab wrist brace (therefore no need for splinting). Pt presents today with stiffness of wrist/forearm and hand, pain at wrist/forearm, swelling, and weakness. Pt would benefit from O.T. to address these deficits and to return pt to PLOF as she was completely independent and working prior to injury.    OT Occupational Profile and History Problem Focused Assessment - Including review of records relating to presenting problem    Occupational performance deficits (Please refer to evaluation for details): ADL's;IADL's;Work    Body Structure / Function / Physical Skills ADL;Strength;Dexterity;Pain;Edema;UE functional use;IADL;Coordination;Sensation    Rehab Potential Good    Clinical Decision Making Several treatment options, min-mod task modification necessary    Comorbidities Affecting Occupational Performance: None    Modification or Assistance to Complete Evaluation  Min-Moderate modification of tasks or assist with assess necessary to complete eval    OT Frequency 1x / week   (due to self pay)   OT Duration --   10 weeks   OT Treatment/Interventions Self-care/ADL training;Moist Heat;Fluidtherapy;DME and/or AE instruction;Splinting;Therapeutic activities;Compression bandaging;Contrast Bath;Ultrasound;Therapeutic exercise;Scar mobilization;Cryotherapy;Passive range of motion;Paraffin;Electrical Stimulation;Manual Therapy;Patient/family education    Plan review A/ROM HEP, add self guided gentle P/ROM HEP ([place] once incision healed)    Consulted and Agree with Plan of Care Patient;Family member/caregiver    Family Member Consulted Son           Patient will benefit from skilled therapeutic intervention in order to improve the following deficits and impairments:   Body Structure / Function / Physical Skills:  ADL,Strength,Dexterity,Pain,Edema,UE functional use,IADL,Coordination,Sensation       Visit Diagnosis: Stiffness of right wrist, not elsewhere classified  Stiffness of joint of right forearm  Pain in right wrist  Localized edema  Muscle weakness (generalized)  Other lack of coordination    Problem List There are no problems to display for this patient.   Kelli Churn, OTR/L 12/02/2020, 11:28 AM  [facility] [address] [place], [place], [address] Phone: [phone]   Fax:  [phone]  Name: Ebony Davis MRN: [id number] Date of Birth: [birth year]

## 2020-12-02 NOTE — Patient Instructions (Signed)
Combination Movement (Active)    Keep elbow firmly at side with wrist straight. Turn palm up and down slowly.  Repeat __10__ times. Do _4-6___ sessions per day.  AROM: Wrist Extension   .  With _right___ palm down, bend wrist up. Repeat __10__ times per set.  Do __4-6__ sessions per day.    AROM: Wrist Flexion   With__Rt___ palm up, bend wrist up. Repeat __10__ times per set.  Do _4-6___ sessions per day.    FINGER: Flexion    Rest hand on surface, thumb up. Close fingers to make a fist. _10__ reps per set, _4-6__ sets per day  AROM: Thumb Flexion / Extension    Actively bend right thumb across palm as far as possible. Hold __5__ seconds. Relax. Then pull thumb back into hitchhike position. Repeat __10__ times per set. Do __4-6__ sets per session.

## 2020-12-09 ENCOUNTER — Other Ambulatory Visit: Payer: Self-pay

## 2020-12-09 ENCOUNTER — Ambulatory Visit: Payer: Self-pay | Attending: Orthopedic Surgery | Admitting: Occupational Therapy

## 2020-12-09 DIAGNOSIS — M25531 Pain in right wrist: Secondary | ICD-10-CM | POA: Insufficient documentation

## 2020-12-09 DIAGNOSIS — R600 Localized edema: Secondary | ICD-10-CM | POA: Insufficient documentation

## 2020-12-09 DIAGNOSIS — M25631 Stiffness of right wrist, not elsewhere classified: Secondary | ICD-10-CM | POA: Insufficient documentation

## 2020-12-09 NOTE — Therapy (Signed)
[facility] [address] [place], [place], [address] Phone: [phone]   Fax:  [phone]  Occupational Therapy Treatment  Patient Details  Name: Ebony Davis MRN: [id number] Date of Birth: 1955-11-10 Referring Provider (OT): Dr. Mack Hook   Encounter Date: 12/09/2020   OT End of Session - 12/09/20 1312    Visit Number 2    Number of Visits 10    Date for OT Re-Evaluation 02/14/21    Authorization Type Self pay (Pt only has MCR Part A until July)    OT Start Time 1230    OT Stop Time 1310    OT Time Calculation (min) 40 min    Activity Tolerance Patient tolerated treatment well    Behavior During Therapy [facility] for tasks assessed/performed           No past medical history on file.  Past Surgical History:  Procedure Laterality Date  . OPEN REDUCTION INTERNAL FIXATION (ORIF) DISTAL RADIAL FRACTURE Right 11/27/2020   Procedure: OPEN TREATMENT RIGHT DISTAL RADIUS FRACTURE;  Surgeon: Mack Hook, MD;  Location: [facility];  Service: Orthopedics;  Laterality: Right;  LENGTH OF SURGERY: 75 MIN  PRE OP BLOCK    There were no vitals filed for this visit.   Subjective Assessment - 12/09/20 1232    Subjective  It hurts when I do my exercises (per dtr-n-law translation)    Patient is accompanied by: Family member   daughter-n-law   Pertinent History ORIF RT distal radius fx 11/27/20    Limitations per protocol    Currently in Pain? Yes    Pain Score 7     Pain Location Wrist    Pain Orientation Right    Pain Type Acute pain;Surgical pain    Pain Onset 1 to 4 weeks ago    Pain Frequency Intermittent    Aggravating Factors  movement    Pain Relieving Factors elevation, support          Reviewed A/ROM HEP. Added self guided P/ROM HEP and performed each as indicated.  Pt issued scar massage info and therapist demo - pt told to be more aggressive at proximal end of scar as this has  healed more, but be more gentle at distal end of scar. Pt issued gel padding to wear at night underneath stockinette and wrist brace to help flatten scar.  Forearm gym for active combined forearm rotation and wrist motion x 6                      OT Education - 12/09/20 1247    Education Details Gentle self P/ROM HEP, scar massage, gel pad at night for scar management    Person(s) Educated Patient;Child(ren)    Methods Explanation;Demonstration;Verbal cues;Handout    Comprehension Verbalized understanding;Returned demonstration;Verbal cues required            OT Short Term Goals - 12/09/20 1312      OT SHORT TERM GOAL #1   Title Pt independent with initial HEP    Time 5    Period Weeks    Status On-going      OT SHORT TERM GOAL #2   Title Pt to verbalize understanding with pain management strategies and edema management strategies    Time 5    Period Weeks    Status On-going      OT SHORT TERM GOAL #3   Title Pt to improve forearm supination and wrist extension by  10* or more    Baseline sup = 48*, wrist ext = 28*    Time 5    Period Weeks    Status On-going             OT Long Term Goals - 12/02/20 1008      OT LONG TERM GOAL #1   Title Independent with updated strengthening HEP    Time 10    Period Weeks    Status New      OT LONG TERM GOAL #2   Title Pt to return to using Rt hand as dominant hand for BADLS and light functional tasks (including writing, eating)    Time 10    Period Weeks    Status New      OT LONG TERM GOAL #3   Title Pt to demo Rt forearm and wrist ROM WFL's    Time 10    Period Weeks    Status New      OT LONG TERM GOAL #4   Title Pt to demo 30 lbs or greater grip strength Rt hand to open jars/containers    Time 10    Period Weeks    Status New                 Plan - 12/09/20 1313    Clinical Impression Statement Pt progressing towards all STG's. Pt tolerating A/ROM and self guided P/ROM today. Incision  area closed w/ a few dissovable stitches remaining but scar/incision is raised    OT Occupational Profile and History Problem Focused Assessment - Including review of records relating to presenting problem    Occupational performance deficits (Please refer to evaluation for details): ADL's;IADL's;Work    Body Structure / Function / Physical Skills ADL;Strength;Dexterity;Pain;Edema;UE functional use;IADL;Coordination;Sensation    Rehab Potential Good    Clinical Decision Making Several treatment options, min-mod task modification necessary    Comorbidities Affecting Occupational Performance: None    Modification or Assistance to Complete Evaluation  Min-Moderate modification of tasks or assist with assess necessary to complete eval    OT Frequency 1x / week   (due to self pay)   OT Duration --   10 weeks   OT Treatment/Interventions Self-care/ADL training;Moist Heat;Fluidtherapy;DME and/or AE instruction;Splinting;Therapeutic activities;Compression bandaging;Contrast Bath;Ultrasound;Therapeutic exercise;Scar mobilization;Cryotherapy;Passive range of motion;Paraffin;Electrical Stimulation;Manual Therapy;Patient/family education    Plan review HEP's and review scar massage, [place] over incision, continue forearm gym, begin wrist winder w/o weight    Consulted and Agree with Plan of Care Patient;Family member/caregiver    Family Member Consulted Son           Patient will benefit from skilled therapeutic intervention in order to improve the following deficits and impairments:   Body Structure / Function / Physical Skills: ADL,Strength,Dexterity,Pain,Edema,UE functional use,IADL,Coordination,Sensation       Visit Diagnosis: Stiffness of right wrist, not elsewhere classified  Stiffness of joint of right forearm  Pain in right wrist  Localized edema    Problem List There are no problems to display for this patient.   Kelli Churn, OTR/L 12/09/2020, 1:28 PM  [facility] [address] [place], [place], [address] Phone: [phone]   Fax:  [phone]  Name: Shunte Senseney MRN: [id number] Date of Birth: 08-29-1956

## 2020-12-09 NOTE — Patient Instructions (Addendum)
   PROM: Wrist Flexion / Extension   Grasp  hand and slowly bend wrist until stretch is felt. Relax. Then stretch as far as possible in opposite direction. Be sure to keep elbow bent.  Hold __10__ sec. each way Repeat _5___ times per set.    Do _4-6___ sessions per day.  Pronation (Passive)   Keep elbow bent at right angle and held firmly to side. Use other hand to turn forearm until palm faces downward. Hold _10___ seconds. Repeat __5__ times. Do _4-6___ sessions per day.  Supination (Passive)   Keep elbow bent at right angle and held firmly at side. Use other hand to turn forearm until palm faces upward. Hold __10__ seconds. Repeat __5__ times. Do _4-6___ sessions per day.   Scar Massage Purpose: To soften/smooth scar tissue.   To desensitize sensitive areas after surgery.   To mechanically break up inner scar tissue, adhesions, therefore allowing freer        movement of injured tendons and muscle.  Technique: Use a cream (Cocoa butter or vitamin E cream) to massage with, as it insures a smooth gliding motion and avoids irritation caused by rubbing skin to skin.  Cream is preferred over a lotion.   May begin as soon as any suture areas are healed.   Apply a firm, steady pressure with your finger-tip, pulling the skin in a circular motion over the scarred area.  Do not rub.  Message 5 - 10 minutes, at least 2 times a day, unless you are getting tender afterwards

## 2020-12-16 ENCOUNTER — Ambulatory Visit: Payer: Self-pay | Admitting: Occupational Therapy

## 2020-12-16 ENCOUNTER — Other Ambulatory Visit: Payer: Self-pay

## 2020-12-16 DIAGNOSIS — R600 Localized edema: Secondary | ICD-10-CM

## 2020-12-16 DIAGNOSIS — M25631 Stiffness of right wrist, not elsewhere classified: Secondary | ICD-10-CM

## 2020-12-16 DIAGNOSIS — M25531 Pain in right wrist: Secondary | ICD-10-CM

## 2020-12-16 NOTE — Therapy (Signed)
[facility] [address] [place], [place], [address] Phone: [phone]   Fax:  [phone]  Occupational Therapy Treatment  Patient Details  Name: Ebony Davis MRN: [id number] Date of Birth: 05/01/1956 Referring Provider (OT): Dr. Mack Hook   Encounter Date: 12/16/2020   OT End of Session - 12/16/20 1506    Visit Number 3    Number of Visits 10    Date for OT Re-Evaluation 02/14/21    Authorization Type Self pay (Pt only has MCR Part A until July)    OT Start Time 1315    OT Stop Time 1400    OT Time Calculation (min) 45 min    Activity Tolerance Patient tolerated treatment well    Behavior During Therapy [facility] for tasks assessed/performed           No past medical history on file.  Past Surgical History:  Procedure Laterality Date  . OPEN REDUCTION INTERNAL FIXATION (ORIF) DISTAL RADIAL FRACTURE Right 11/27/2020   Procedure: OPEN TREATMENT RIGHT DISTAL RADIUS FRACTURE;  Surgeon: Mack Hook, MD;  Location: [facility];  Service: Orthopedics;  Laterality: Right;  LENGTH OF SURGERY: 75 MIN  PRE OP BLOCK    There were no vitals filed for this visit.   Subjective Assessment - 12/16/20 1336    Subjective  Pain up to 8/10 with exercises    Patient is accompanied by: Family member   daughter-n-law   Pertinent History ORIF RT distal radius fx 11/27/20    Limitations per protocol    Currently in Pain? Yes    Pain Score 0-No pain   up to 8/10 w/ ex's   Pain Location Wrist    Pain Orientation Right    Pain Descriptors / Indicators Aching;Sore    Pain Type Acute pain    Pain Onset 1 to 4 weeks ago    Pain Frequency Intermittent    Aggravating Factors  movement    Pain Relieving Factors elevation, support           Pt has been doing scar massage 2x/day and been wearing gel pad at night for scarring which has improved considerably (both in healing and flattening of scar).   Ultrasound x 8 min over incision area to help manage scar tissue and edema at 20% pulsed, 3 Mhz, 0.8 wts/cm2.  Pt sized for and issued different pre-fab wrist brace (easier to don/doff) and instructed she could wear this during the day if she prefers. Pt instructed to continue wearing previous pre-fab brace at night and if new one starts to bother her during the day.  Reviewed A/ROM and P/ROM HEP and pt returned demo w/ clarification for P/ROM HEP to ensure proper technique and for better outcome.  Forearm gym x 2 and wrist winder (no weight) x 2                       OT Short Term Goals - 12/16/20 1507      OT SHORT TERM GOAL #1   Title Pt independent with initial HEP    Time 5    Period Weeks    Status Achieved      OT SHORT TERM GOAL #2   Title Pt to verbalize understanding with pain management strategies and edema management strategies    Time 5    Period Weeks    Status On-going      OT SHORT TERM GOAL #3   Title Pt  to improve forearm supination and wrist extension by 10* or more    Baseline sup = 48*, wrist ext = 28*    Time 5    Period Weeks    Status On-going             OT Long Term Goals - 12/02/20 1008      OT LONG TERM GOAL #1   Title Independent with updated strengthening HEP    Time 10    Period Weeks    Status New      OT LONG TERM GOAL #2   Title Pt to return to using Rt hand as dominant hand for BADLS and light functional tasks (including writing, eating)    Time 10    Period Weeks    Status New      OT LONG TERM GOAL #3   Title Pt to demo Rt forearm and wrist ROM WFL's    Time 10    Period Weeks    Status New      OT LONG TERM GOAL #4   Title Pt to demo 30 lbs or greater grip strength Rt hand to open jars/containers    Time 10    Period Weeks    Status New                 Plan - 12/16/20 1507    Clinical Impression Statement Pt met STG #1 and progressing towards remaining STG's. Pt with observable improvements  in wrist and forearm ROM and significant improvement in scar flattening and healing.    OT Occupational Profile and History Problem Focused Assessment - Including review of records relating to presenting problem    Occupational performance deficits (Please refer to evaluation for details): ADL's;IADL's;Work    Body Structure / Function / Physical Skills ADL;Strength;Dexterity;Pain;Edema;UE functional use;IADL;Coordination;Sensation    Rehab Potential Good    Clinical Decision Making Several treatment options, min-mod task modification necessary    Comorbidities Affecting Occupational Performance: None    Modification or Assistance to Complete Evaluation  Min-Moderate modification of tasks or assist with assess necessary to complete eval    OT Frequency 1x / week   (due to self pay)   OT Duration --   10 weeks   OT Treatment/Interventions Self-care/ADL training;Moist Heat;Fluidtherapy;DME and/or AE instruction;Splinting;Therapeutic activities;Compression bandaging;Contrast Bath;Ultrasound;Therapeutic exercise;Scar mobilization;Cryotherapy;Passive range of motion;Paraffin;Electrical Stimulation;Manual Therapy;Patient/family education    Plan continue [place], continue A/ROM and P/ROM, re-assess ROM and check remaining STG's.    Consulted and Agree with Plan of Care Patient;Family member/caregiver    Family Member Consulted Son           Patient will benefit from skilled therapeutic intervention in order to improve the following deficits and impairments:   Body Structure / Function / Physical Skills: ADL,Strength,Dexterity,Pain,Edema,UE functional use,IADL,Coordination,Sensation       Visit Diagnosis: Stiffness of right wrist, not elsewhere classified  Stiffness of joint of right forearm  Pain in right wrist  Localized edema    Problem List There are no problems to display for this patient.   Carey Bullocks, OTR/L 12/16/2020, 3:09 PM  [facility] [address] [place], [place], [address] Phone: [phone]   Fax:  [phone]  Name: Charlen Bakula MRN: [id number] Date of Birth: [birth year]

## 2020-12-23 ENCOUNTER — Ambulatory Visit: Payer: Self-pay | Admitting: Occupational Therapy

## 2020-12-23 ENCOUNTER — Other Ambulatory Visit: Payer: Self-pay

## 2020-12-23 DIAGNOSIS — M25631 Stiffness of right wrist, not elsewhere classified: Secondary | ICD-10-CM

## 2020-12-23 DIAGNOSIS — R600 Localized edema: Secondary | ICD-10-CM

## 2020-12-23 DIAGNOSIS — M25531 Pain in right wrist: Secondary | ICD-10-CM

## 2020-12-23 NOTE — Therapy (Signed)
[facility] Rehabilitation [facility] [address] [place], [place], [address] Phone: [phone]   Fax:  [phone]  Occupational Therapy Treatment  Patient Details  Name: Ebony Davis MRN: [id number] Date of Birth: 1956-06-27 Referring Provider (OT): Dr. Mack Hook   Encounter Date: 12/23/2020   OT End of Session - 12/23/20 1355    Visit Number 4    Number of Visits 10    Date for OT Re-Evaluation 02/14/21    Authorization Type Self pay (Pt only has MCR Part A until July)    OT Start Time 1320    OT Stop Time 1400    OT Time Calculation (min) 40 min    Activity Tolerance Patient tolerated treatment well    Behavior During Therapy [facility] for tasks assessed/performed           No past medical history on file.  Past Surgical History:  Procedure Laterality Date  . OPEN REDUCTION INTERNAL FIXATION (ORIF) DISTAL RADIAL FRACTURE Right 11/27/2020   Procedure: OPEN TREATMENT RIGHT DISTAL RADIUS FRACTURE;  Surgeon: Mack Hook, MD;  Location: [facility];  Service: Orthopedics;  Laterality: Right;  LENGTH OF SURGERY: 75 MIN  PRE OP BLOCK    There were no vitals filed for this visit.   Subjective Assessment - 12/23/20 1323    Subjective  Pt sees MD 01/07/21    Patient is accompanied by: Family member   daughter-n-law   Pertinent History ORIF RT distal radius fx 11/27/20    Limitations per protocol    Currently in Pain? Yes    Pain Score 9     Pain Location Wrist    Pain Orientation Right    Pain Descriptors / Indicators Aching;Sore    Pain Type Acute pain    Pain Onset 1 to 4 weeks ago    Pain Frequency Intermittent    Aggravating Factors  movement    Pain Relieving Factors elevation, support           Assessed STG's and progress to date - see below progress and ROM measurements. Ultrasound x 8 min, 20%, , 0.8 wts/cm2 over incision area and into palm for scar and edema management.  Discussed using Rt  hand to eat/feed self and to write w/ wrist brace on, as well as help bathe in shower to increase functional use. Pt practiced writing name x 3, and retrieving non weighted cones out of high shelf cabinet and replacing w/ RUE w/ brace on.  Forearm gym x 4 reps.                        OT Short Term Goals - 12/23/20 1510      OT SHORT TERM GOAL #1   Title Pt independent with initial HEP    Time 5    Period Weeks    Status Achieved      OT SHORT TERM GOAL #2   Title Pt to verbalize understanding with pain management strategies and edema management strategies    Time 5    Period Weeks    Status Achieved      OT SHORT TERM GOAL #3   Title Pt to improve forearm supination and wrist extension by 10* or more    Baseline sup = 48*, wrist ext = 28*    Time 5    Period Weeks    Status Achieved   Wrist ext = 50* w/ fingers flexed, 55* w/ fingers extended;  sup = 80*            OT Long Term Goals - 12/23/20 1511      OT LONG TERM GOAL #1   Title Independent with updated strengthening HEP    Time 10    Period Weeks    Status New      OT LONG TERM GOAL #2   Title Pt to return to using Rt hand as dominant hand for BADLS and light functional tasks (including writing, eating)    Time 10    Period Weeks    Status On-going      OT LONG TERM GOAL #3   Title Pt to demo Rt forearm and wrist ROM WFL's    Time 10    Period Weeks    Status On-going      OT LONG TERM GOAL #4   Title Pt to demo 30 lbs or greater grip strength Rt hand to open jars/containers    Time 10    Period Weeks    Status New                 Plan - 12/23/20 1511    Clinical Impression Statement Pt has met all STG's at this time. Pt's wrist and foream ROM have improved significantly since initial evaluation. Pt does report a lot of itching around incision which therapist explained was usually a sign of healing.    OT Occupational Profile and History Problem Focused Assessment - Including  review of records relating to presenting problem    Occupational performance deficits (Please refer to evaluation for details): ADL's;IADL's;Work    Body Structure / Function / Physical Skills ADL;Strength;Dexterity;Pain;Edema;UE functional use;IADL;Coordination;Sensation    Rehab Potential Good    Clinical Decision Making Several treatment options, min-mod task modification necessary    Comorbidities Affecting Occupational Performance: None    Modification or Assistance to Complete Evaluation  Min-Moderate modification of tasks or assist with assess necessary to complete eval    OT Frequency 1x / week   (due to self pay)   OT Duration --   10 weeks   OT Treatment/Interventions Self-care/ADL training;Moist Heat;Fluidtherapy;DME and/or AE instruction;Splinting;Therapeutic activities;Compression bandaging;Contrast Bath;Ultrasound;Therapeutic exercise;Scar mobilization;Cryotherapy;Passive range of motion;Paraffin;Electrical Stimulation;Manual Therapy;Patient/family education    Plan fluidotherapy, continue A/ROM and P/ROM of wrist, (write note to MD following visit 01/06/21 as pt sees MD again on 01/07/21 at 6 weeks post-op - at that time will ask if she may begin hand strengthening w/ putty and weaning from splint)    Consulted and Agree with Plan of Care Patient;Family member/caregiver    Family Member Consulted Son           Patient will benefit from skilled therapeutic intervention in order to improve the following deficits and impairments:   Body Structure / Function / Physical Skills: ADL,Strength,Dexterity,Pain,Edema,UE functional use,IADL,Coordination,Sensation       Visit Diagnosis: Stiffness of right wrist, not elsewhere classified  Stiffness of joint of right forearm  Pain in right wrist  Localized edema    Problem List There are no problems to display for this patient.   Carey Bullocks, OTR/L 12/23/2020, 3:15 PM  [facility] [address] [place], [place], [address] Phone: [phone]   Fax:  [phone]  Name: Ebony Davis MRN: [id number] Date of Birth: 05/19/1956

## 2020-12-30 ENCOUNTER — Other Ambulatory Visit: Payer: Self-pay

## 2020-12-30 ENCOUNTER — Ambulatory Visit: Payer: Self-pay | Admitting: Occupational Therapy

## 2020-12-30 DIAGNOSIS — M25631 Stiffness of right wrist, not elsewhere classified: Secondary | ICD-10-CM

## 2020-12-30 DIAGNOSIS — R600 Localized edema: Secondary | ICD-10-CM

## 2020-12-30 DIAGNOSIS — M25531 Pain in right wrist: Secondary | ICD-10-CM

## 2020-12-30 NOTE — Therapy (Signed)
[facility] [address] [place], [place], [address] Phone: [phone]   Fax:  [phone]  Occupational Therapy Treatment  Patient Details  Name: Ebony Davis MRN: [id number] Date of Birth: Apr 14, 1956 Referring Provider (OT): Dr. Mack Hook   Encounter Date: 12/30/2020   OT End of Session - 12/30/20 1508    Visit Number 5    Number of Visits 10    Date for OT Re-Evaluation 02/14/21    Authorization Type Self pay (Pt only has MCR Part A until July)    OT Start Time 1400    OT Stop Time 1443    OT Time Calculation (min) 43 min    Activity Tolerance Patient tolerated treatment well    Behavior During Therapy [facility] for tasks assessed/performed           No past medical history on file.  Past Surgical History:  Procedure Laterality Date  . OPEN REDUCTION INTERNAL FIXATION (ORIF) DISTAL RADIAL FRACTURE Right 11/27/2020   Procedure: OPEN TREATMENT RIGHT DISTAL RADIUS FRACTURE;  Surgeon: Mack Hook, MD;  Location: [facility];  Service: Orthopedics;  Laterality: Right;  LENGTH OF SURGERY: 75 MIN  PRE OP BLOCK    There were no vitals filed for this visit.   Subjective Assessment - 12/30/20 1409    Subjective  Daughter reports pt's pain has been better. Pt sees MD 01/07/21    Patient is accompanied by: Family member   daughter-n-law   Pertinent History ORIF RT distal radius fx 11/27/20    Limitations per protocol    Currently in Pain? Yes    Pain Score 4    this week, but currently no pain   Pain Location Wrist    Pain Orientation Right    Pain Descriptors / Indicators Aching;Sore    Pain Type Acute pain    Pain Onset More than a month ago    Pain Frequency Intermittent    Aggravating Factors  certain movements    Pain Relieving Factors elevation, support          Fluidotherapy x 10 min Rt hand to decrease stiffness and for desensitization.  A/ROM and P/ROM for wrist flex/ext  and active sup and pronation. Wrist winder x 4 reps.  Note sent to MD today for next week's MD appointment re: pt's progress and when ok to begin strengthening. Since we cannot progress further until pt sees MD, decided to cancel next week's O.T. appointment on 01/06/21 as pt sees MD on 01/07/21.                         OT Short Term Goals - 12/23/20 1510      OT SHORT TERM GOAL #1   Title Pt independent with initial HEP    Time 5    Period Weeks    Status Achieved      OT SHORT TERM GOAL #2   Title Pt to verbalize understanding with pain management strategies and edema management strategies    Time 5    Period Weeks    Status Achieved      OT SHORT TERM GOAL #3   Title Pt to improve forearm supination and wrist extension by 10* or more    Baseline sup = 48*, wrist ext = 28*    Time 5    Period Weeks    Status Achieved   Wrist ext = 50* w/ fingers flexed, 55* w/ fingers  extended; sup = 80*            OT Long Term Goals - 12/23/20 1511      OT LONG TERM GOAL #1   Title Independent with updated strengthening HEP    Time 10    Period Weeks    Status New      OT LONG TERM GOAL #2   Title Pt to return to using Rt hand as dominant hand for BADLS and light functional tasks (including writing, eating)    Time 10    Period Weeks    Status On-going      OT LONG TERM GOAL #3   Title Pt to demo Rt forearm and wrist ROM WFL's    Time 10    Period Weeks    Status On-going      OT LONG TERM GOAL #4   Title Pt to demo 30 lbs or greater grip strength Rt hand to open jars/containers    Time 10    Period Weeks    Status New                 Plan - 12/30/20 1509    Clinical Impression Statement Pt is progressing well with ROM and pain better this week w/ avg 4-5/10 and no pain during today's session.    OT Occupational Profile and History Problem Focused Assessment - Including review of records relating to presenting problem    Occupational performance  deficits (Please refer to evaluation for details): ADL's;IADL's;Work    Body Structure / Function / Physical Skills ADL;Strength;Dexterity;Pain;Edema;UE functional use;IADL;Coordination;Sensation    Rehab Potential Good    Clinical Decision Making Several treatment options, min-mod task modification necessary    Comorbidities Affecting Occupational Performance: None    Modification or Assistance to Complete Evaluation  Min-Moderate modification of tasks or assist with assess necessary to complete eval    OT Frequency 1x / week   (due to self pay)   OT Duration --   10 weeks   OT Treatment/Interventions Self-care/ADL training;Moist Heat;Fluidtherapy;DME and/or AE instruction;Splinting;Therapeutic activities;Compression bandaging;Contrast Bath;Ultrasound;Therapeutic exercise;Scar mobilization;Cryotherapy;Passive range of motion;Paraffin;Electrical Stimulation;Manual Therapy;Patient/family education    Plan Pt to return after MD appointment on 01/07/21 - note sent today on when we can progress to hand strengthening, wrist strengthening, and d/c from splint. Pt will return on 01/13/21    Consulted and Agree with Plan of Care Patient;Family member/caregiver    Family Member Consulted Son           Patient will benefit from skilled therapeutic intervention in order to improve the following deficits and impairments:   Body Structure / Function / Physical Skills: ADL,Strength,Dexterity,Pain,Edema,UE functional use,IADL,Coordination,Sensation       Visit Diagnosis: Stiffness of right wrist, not elsewhere classified  Stiffness of joint of right forearm  Pain in right wrist  Localized edema    Problem List There are no problems to display for this patient.   Kelli Churn, OTR/L 12/30/2020, 3:11 PM  [facility] [address] [place], [place], [address] Phone: [phone]   Fax:  [phone]  Name: Ebony Davis MRN: [id number] Date of Birth: 1956/02/16

## 2021-01-06 ENCOUNTER — Ambulatory Visit: Payer: Self-pay | Admitting: Occupational Therapy

## 2021-01-13 ENCOUNTER — Other Ambulatory Visit: Payer: Self-pay

## 2021-01-13 ENCOUNTER — Ambulatory Visit: Payer: Self-pay | Attending: Orthopedic Surgery | Admitting: Occupational Therapy

## 2021-01-13 DIAGNOSIS — M25631 Stiffness of right wrist, not elsewhere classified: Secondary | ICD-10-CM | POA: Insufficient documentation

## 2021-01-13 DIAGNOSIS — M25531 Pain in right wrist: Secondary | ICD-10-CM | POA: Insufficient documentation

## 2021-01-13 DIAGNOSIS — M6281 Muscle weakness (generalized): Secondary | ICD-10-CM | POA: Insufficient documentation

## 2021-01-13 NOTE — Therapy (Signed)
[facility] [address] [place], [place], [address] Phone: [phone]   Fax:  [phone]  Occupational Therapy Treatment  Patient Details  Name: Ebony Davis MRN: [id number] Date of Birth: 08-11-1956 Referring Provider (OT): Dr. Mack Hook   Encounter Date: 01/13/2021   OT End of Session - 01/13/21 1341    Visit Number 6    Number of Visits 10    Date for OT Re-Evaluation 02/14/21    Authorization Type Self pay (Pt only has MCR Part A until July)    OT Start Time 1315    OT Stop Time 1358    OT Time Calculation (min) 43 min    Activity Tolerance Patient tolerated treatment well    Behavior During Therapy [facility] for tasks assessed/performed           No past medical history on file.  Past Surgical History:  Procedure Laterality Date  . OPEN REDUCTION INTERNAL FIXATION (ORIF) DISTAL RADIAL FRACTURE Right 11/27/2020   Procedure: OPEN TREATMENT RIGHT DISTAL RADIUS FRACTURE;  Surgeon: Mack Hook, MD;  Location: [facility];  Service: Orthopedics;  Laterality: Right;  LENGTH OF SURGERY: 75 MIN  PRE OP BLOCK    There were no vitals filed for this visit.   Subjective Assessment - 01/13/21 1234    Subjective  Pt cleared for hand strengthening and tapering from splint. Pt reported pain last night near SL ligament (dorsal wrist) at night and put splint back on at night, but no pain today.    Patient is accompanied by: Family member   daughter-n-law   Pertinent History ORIF RT distal radius fx 11/27/20    Limitations cleared for hand strengthening and tapering/weaning from splint. Wrist strengthening when she achieves full wrist ROM or at 10 weeks    Currently in Pain? No/denies    Pain Onset More than a month ago              [facility] OT Assessment - 01/13/21 0001      Hand Function   Right Hand Grip (lbs) 14 LBS    Left Hand Grip (lbs) 41.4 LBS                    OT  Treatments/Exercises (OP) - 01/13/21 0001      ADLs   ADL Comments Pt returns today after MD appt and cleared for hand strengthening at this time. Also discussed tapering/weaning from splint per MD recommendations. Pt instructed to wear brace when out, but can take off more at home. Pt shown less bulky neoprene wrist brace to wear at night if needed (pt to purchase at [employer] or [employer]) - pt does not need to wear at night at this time, but reported pain last night without wrist brace      Wrist Exercises   Other wrist exercises Issued more aggressive P/ROM for wrist - pt demo each x 5 reps holding 10 sec. Pt instructed to continue passive supination secondary to tightness at end range    Other wrist exercises Wrist/forearm gym x 4      Hand Exercises   Other Hand Exercises Pt issued putty HEP for grip and pinch strength. Pt issued yellow resistance putty                    OT Short Term Goals - 12/23/20 1510      OT SHORT TERM GOAL #1   Title Pt independent with initial  HEP    Time 5    Period Weeks    Status Achieved      OT SHORT TERM GOAL #2   Title Pt to verbalize understanding with pain management strategies and edema management strategies    Time 5    Period Weeks    Status Achieved      OT SHORT TERM GOAL #3   Title Pt to improve forearm supination and wrist extension by 10* or more    Baseline sup = 48*, wrist ext = 28*    Time 5    Period Weeks    Status Achieved   Wrist ext = 50* w/ fingers flexed, 55* w/ fingers extended; sup = 80*            OT Long Term Goals - 01/13/21 1341      OT LONG TERM GOAL #1   Title Independent with updated strengthening HEP    Time 10    Period Weeks    Status On-going      OT LONG TERM GOAL #2   Title Pt to return to using Rt hand as dominant hand for BADLS and light functional tasks (including writing, eating)    Time 10    Period Weeks    Status On-going      OT LONG TERM GOAL #3   Title Pt to demo Rt forearm and  wrist ROM WFL's    Time 10    Period Weeks    Status On-going      OT LONG TERM GOAL #4   Title Pt to demo 30 lbs or greater grip strength Rt hand to open jars/containers    Time 10    Period Weeks    Status On-going                 Plan - 01/13/21 1341    Clinical Impression Statement Pt now almost 7 weeks post-op and cleared for hand strengthening and weaning from brace. Pt to begin wrist strengthening when full wrist ROM is achieved or at 10 weeks per MD recommendations.    OT Occupational Profile and History Problem Focused Assessment - Including review of records relating to presenting problem    Occupational performance deficits (Please refer to evaluation for details): ADL's;IADL's;Work    Body Structure / Function / Physical Skills ADL;Strength;Dexterity;Pain;Edema;UE functional use;IADL;Coordination;Sensation    Rehab Potential Good    Clinical Decision Making Several treatment options, min-mod task modification necessary    Comorbidities Affecting Occupational Performance: None    Modification or Assistance to Complete Evaluation  Min-Moderate modification of tasks or assist with assess necessary to complete eval    OT Frequency 1x / week   (due to self pay)   OT Duration --   10 weeks   OT Treatment/Interventions Self-care/ADL training;Moist Heat;Fluidtherapy;DME and/or AE instruction;Splinting;Therapeutic activities;Compression bandaging;Contrast Bath;Ultrasound;Therapeutic exercise;Scar mobilization;Cryotherapy;Passive range of motion;Paraffin;Electrical Stimulation;Manual Therapy;Patient/family education    Plan continue fluido, hand strengthening, wrist/forearm ROM, anticipate wrist strengthening 01/26/21.    Consulted and Agree with Plan of Care Patient;Family member/caregiver    Family Member Consulted Son           Patient will benefit from skilled therapeutic intervention in order to improve the following deficits and impairments:   Body Structure / Function  / Physical Skills: ADL,Strength,Dexterity,Pain,Edema,UE functional use,IADL,Coordination,Sensation       Visit Diagnosis: Stiffness of right wrist, not elsewhere classified  Stiffness of joint of right forearm  Pain in right wrist  Muscle  weakness (generalized)    Problem List There are no problems to display for this patient.   Kelli Churn, OTR/L 01/13/2021, 3:20 PM  [facility] [address] [place], [place], [address] Phone: [phone]   Fax:  [phone]  Name: Ebony Davis MRN: [id number] Date of Birth: 1956-06-09

## 2021-01-13 NOTE — Patient Instructions (Signed)
Composite Extension (Passive Flexor Stretch)    Sitting with elbows on table and palms together, slowly lower wrists toward table until stretch is felt. Be sure to keep palms together throughout stretch. Hold __10__ seconds. Relax. Repeat __5__ times. Do __4__ sessions per day.  Wrist Passive Flexion    Rest right forearm on table, elbow straight, hand palm-down over edge. Bend wrist by pressing hand down with other hand. Hold _10___ seconds. Repeat __5__ times. Do __4__ sessions per day.  1. Grip Strengthening (Resistive Putty)   Squeeze putty using thumb and all fingers. Repeat _20___ times. Do __2__ sessions per day.   2. Roll putty into tube on table and pinch between first two fingers and thumb x 10 reps. Do 2 sessions per day

## 2021-01-20 ENCOUNTER — Ambulatory Visit: Payer: Self-pay | Admitting: Occupational Therapy

## 2021-01-26 ENCOUNTER — Other Ambulatory Visit: Payer: Self-pay

## 2021-01-26 ENCOUNTER — Ambulatory Visit: Payer: Self-pay | Admitting: Occupational Therapy

## 2021-01-26 DIAGNOSIS — M6281 Muscle weakness (generalized): Secondary | ICD-10-CM

## 2021-01-26 DIAGNOSIS — M25531 Pain in right wrist: Secondary | ICD-10-CM

## 2021-01-26 DIAGNOSIS — M25631 Stiffness of right wrist, not elsewhere classified: Secondary | ICD-10-CM

## 2021-01-26 NOTE — Patient Instructions (Signed)
Extension (Resistive)    With wrist over edge of table, lift __16__ ounces (1 lb), keeping arm on table surface. Hold __3__ seconds. Lower slowly. Repeat _10___ times. Do __2__ sessions per day. Can do every other day if sore  Flexion (Resistive)    With hand palm-up and holding __16__ ounces ( 1 lb) , bend hand toward you at wrist. Hold _3___ seconds. Relax slowly. Repeat __10__ times. Do _2___ sessions per day. Can do every other day if sore

## 2021-01-26 NOTE — Therapy (Signed)
[facility] [address] [place], [place], [address] Phone: [phone]   Fax:  [phone]  Occupational Therapy Treatment  Patient Details  Name: Ebony Davis MRN: [id number] Date of Birth: Apr 21, 1956 Referring Provider (OT): Dr. Mack Hook   Encounter Date: 01/26/2021   OT End of Session - 01/26/21 1403    Visit Number 7    Number of Visits 10    Date for OT Re-Evaluation 02/14/21    Authorization Type Self pay (Pt only has MCR Part A until July)    OT Start Time 1315    OT Stop Time 1355    OT Time Calculation (min) 40 min    Activity Tolerance Patient tolerated treatment well    Behavior During Therapy [facility] for tasks assessed/performed           No past medical history on file.  Past Surgical History:  Procedure Laterality Date  . OPEN REDUCTION INTERNAL FIXATION (ORIF) DISTAL RADIAL FRACTURE Right 11/27/2020   Procedure: OPEN TREATMENT RIGHT DISTAL RADIUS FRACTURE;  Surgeon: Mack Hook, MD;  Location: [facility];  Service: Orthopedics;  Laterality: Right;  LENGTH OF SURGERY: 75 MIN  PRE OP BLOCK    There were no vitals filed for this visit.   Subjective Assessment - 01/26/21 1328    Subjective  Via son, pt reports mild pain on ulnar side of wrist at night    Patient is accompanied by: Family member   daughter-n-law   Pertinent History ORIF RT distal radius fx 11/27/20    Limitations cleared for hand strengthening and tapering/weaning from splint. Wrist strengthening when she achieves full wrist ROM or at 10 weeks    Currently in Pain? Yes    Pain Score 2     Pain Location Wrist    Pain Orientation Right    Pain Descriptors / Indicators Aching    Pain Onset More than a month ago    Pain Frequency Intermittent    Aggravating Factors  night time, certain movements    Pain Relieving Factors elevation, support              [facility] OT Assessment - 01/26/21 0001       ROM / Strength   AROM / PROM / Strength AROM      AROM   Overall AROM Comments Rt wrist flex = 65* (Lt = 75*), ext = 55* ( Lt = 60*)      Hand Function   Right Hand Grip (lbs) 26 lbs                    OT Treatments/Exercises (OP) - 01/26/21 0001      ADLs   ADL Comments Pt issued stockinette to wear between light wrist brace at night d/t complaints of itching/rash, but instructed to try and wean to just compression garment (issue tensogrip stockinette) for this. Reinforced only wearing D-ring brace when out and try to not wear anything for wrist when at home during the day      Exercises   Exercises Hand   Updated putty HEP to red resistance putty and performed each as indicated (grip x 20, pinch x 10)     Wrist Exercises   Other wrist exercises Reviewed passive stretches including prayer stretch for ext and wrist flex stretch. Wrist/forearm gym x 4    Other wrist exercises Initiated light wrist strengthening today with 1 lb weight for wrist flexion and extension x 10  reps each - see pt instructions      Modalities   Modalities Fluidotherapy      RUE Fluidotherapy   Number Minutes Fluidotherapy 10 Minutes    RUE Fluidotherapy Location Hand;Wrist    Comments at beginning of session to decrease pain/stiffness                  OT Education - 01/26/21 1349    Education Details light wrist strengthening HEP    Person(s) Educated Patient;Child(ren)    Methods Explanation;Demonstration;Verbal cues;Handout    Comprehension Verbalized understanding;Returned demonstration;Verbal cues required            OT Short Term Goals - 12/23/20 1510      OT SHORT TERM GOAL #1   Title Pt independent with initial HEP    Time 5    Period Weeks    Status Achieved      OT SHORT TERM GOAL #2   Title Pt to verbalize understanding with pain management strategies and edema management strategies    Time 5    Period Weeks    Status Achieved      OT SHORT TERM GOAL #3    Title Pt to improve forearm supination and wrist extension by 10* or more    Baseline sup = 48*, wrist ext = 28*    Time 5    Period Weeks    Status Achieved   Wrist ext = 50* w/ fingers flexed, 55* w/ fingers extended; sup = 80*            OT Long Term Goals - 01/26/21 1404      OT LONG TERM GOAL #1   Title Independent with updated strengthening HEP    Time 10    Period Weeks    Status On-going      OT LONG TERM GOAL #2   Title Pt to return to using Rt hand as dominant hand for BADLS and light functional tasks (including writing, eating)    Time 10    Period Weeks    Status On-going      OT LONG TERM GOAL #3   Title Pt to demo Rt forearm and wrist ROM WFL's    Time 10    Period Weeks    Status Achieved      OT LONG TERM GOAL #4   Title Pt to demo 30 lbs or greater grip strength Rt hand to open jars/containers    Time 10    Period Weeks    Status On-going   26 lbs                Plan - 01/26/21 1404    Clinical Impression Statement Pt progressing towards remaining goals. Pt with slightly increased pain reported on ulnar side at night.    OT Occupational Profile and History Problem Focused Assessment - Including review of records relating to presenting problem    Occupational performance deficits (Please refer to evaluation for details): ADL's;IADL's;Work    Body Structure / Function / Physical Skills ADL;Strength;Dexterity;Pain;Edema;UE functional use;IADL;Coordination;Sensation    Rehab Potential Good    Clinical Decision Making Several treatment options, min-mod task modification necessary    Comorbidities Affecting Occupational Performance: None    Modification or Assistance to Complete Evaluation  Min-Moderate modification of tasks or assist with assess necessary to complete eval    OT Frequency 1x / week   (due to self pay)   OT Duration --   10 weeks  OT Treatment/Interventions Self-care/ADL training;Moist Heat;Fluidtherapy;DME and/or AE  instruction;Splinting;Therapeutic activities;Compression bandaging;Contrast Bath;Ultrasound;Therapeutic exercise;Scar mobilization;Cryotherapy;Passive range of motion;Paraffin;Electrical Stimulation;Manual Therapy;Patient/family education    Plan progress to 2 lbs for wrist strengthening, add light hammer for forearm strengthening in pron/sup, possibly need 1-2 more visits scheduled    Consulted and Agree with Plan of Care Patient;Family member/caregiver    Family Member Consulted Son           Patient will benefit from skilled therapeutic intervention in order to improve the following deficits and impairments:   Body Structure / Function / Physical Skills: ADL,Strength,Dexterity,Pain,Edema,UE functional use,IADL,Coordination,Sensation       Visit Diagnosis: Stiffness of right wrist, not elsewhere classified  Stiffness of joint of right forearm  Pain in right wrist  Muscle weakness (generalized)    Problem List There are no problems to display for this patient.   Kelli Churn, OTR/L 01/26/2021, 2:10 PM  [facility] [address] [place], [place], [address] Phone: [phone]   Fax:  [phone]  Name: Ebony Davis MRN: [id number] Date of Birth: [birth year]

## 2021-02-10 ENCOUNTER — Other Ambulatory Visit: Payer: Self-pay

## 2021-02-10 ENCOUNTER — Ambulatory Visit: Payer: Self-pay | Attending: Orthopedic Surgery | Admitting: Occupational Therapy

## 2021-02-10 DIAGNOSIS — M6281 Muscle weakness (generalized): Secondary | ICD-10-CM | POA: Insufficient documentation

## 2021-02-10 DIAGNOSIS — M25631 Stiffness of right wrist, not elsewhere classified: Secondary | ICD-10-CM | POA: Insufficient documentation

## 2021-02-10 NOTE — Therapy (Signed)
[facility] [address] [place], [place], [address] Phone: [phone]   Fax:  [phone]  Occupational Therapy Treatment  Patient Details  Name: Ebony Davis MRN: [id number] Date of Birth: [birth year] Referring Provider (OT): Dr. Milly Jakob   Encounter Date: 02/10/2021   OT End of Session - 02/10/21 1443    Visit Number 8    Number of Visits 10    Date for OT Re-Evaluation 02/14/21    Authorization Type Self pay (Pt only has MCR Part A until July)    OT Start Time 1400    OT Stop Time 1438    OT Time Calculation (min) 38 min    Activity Tolerance Patient tolerated treatment well    Behavior During Therapy [facility] for tasks assessed/performed           No past medical history on file.  Past Surgical History:  Procedure Laterality Date  . OPEN REDUCTION INTERNAL FIXATION (ORIF) DISTAL RADIAL FRACTURE Right 11/27/2020   Procedure: OPEN TREATMENT RIGHT DISTAL RADIUS FRACTURE;  Surgeon: Milly Jakob, MD;  Location: [facility];  Service: Orthopedics;  Laterality: Right;  LENGTH OF SURGERY: 75 MIN  PRE OP BLOCK    There were no vitals filed for this visit.   Subjective Assessment - 02/10/21 1410    Subjective  Pt reports pain this morning on ulnar side of forearm but much better now    Patient is accompanied by: Family member   daughter-n-law   Pertinent History ORIF RT distal radius fx 11/27/20    Limitations cleared for hand strengthening and tapering/weaning from splint. Wrist strengthening when she achieves full wrist ROM or at 10 weeks    Currently in Pain? Yes    Pain Score 1     Pain Location --   wrist/forearm   Pain Orientation Right    Pain Descriptors / Indicators Aching    Pain Type Acute pain    Pain Onset More than a month ago    Pain Frequency Intermittent    Aggravating Factors  night time, certain movements    Pain Relieving Factors elevation, support                         OT Treatments/Exercises (OP) - 02/10/21 0001      ADLs   ADL Comments Pt has no further restrictions (per last MD visit) - pt encouraged to fully d/c splint at this time and gradually build up strength (pt now performing wrist ex's w/ 2 lb weight and can add 1 lb each week until WNL's). Reviewed goals and progress to date and discussed d/c      Wrist Exercises   Other wrist exercises Wrist flex/ext with 2 lb weight x 10 reps each. Added weighted/resistive sup/pronation with light hammer x 10 reps each (issued as HEP)    Other wrist exercises Gripper set at level 1 resistance to pick up blocks for sustained grip strength. Grip strength remains the same this week. Encouraged pt to continue with putty HEP as well      RUE Fluidotherapy   Number Minutes Fluidotherapy 10 Minutes    RUE Fluidotherapy Location Hand;Wrist    Comments at beginning of session to decr pain                  OT Education - 02/10/21 1443    Education Details Added resistive sup/pron to HEP    Person(s) Educated Patient  Methods Explanation;Demonstration;Verbal cues;Handout    Comprehension Verbalized understanding;Returned demonstration            OT Short Term Goals - 12/23/20 1510      OT SHORT TERM GOAL #1   Title Pt independent with initial HEP    Time 5    Period Weeks    Status Achieved      OT SHORT TERM GOAL #2   Title Pt to verbalize understanding with pain management strategies and edema management strategies    Time 5    Period Weeks    Status Achieved      OT SHORT TERM GOAL #3   Title Pt to improve forearm supination and wrist extension by 10* or more    Baseline sup = 48*, wrist ext = 28*    Time 5    Period Weeks    Status Achieved   Wrist ext = 50* w/ fingers flexed, 55* w/ fingers extended; sup = 80*            OT Long Term Goals - 02/10/21 1445      OT LONG TERM GOAL #1   Title Independent with updated strengthening HEP    Time 10     Period Weeks    Status Achieved      OT LONG TERM GOAL #2   Title Pt to return to using Rt hand as dominant hand for BADLS and light functional tasks (including writing, eating)    Time 10    Period Weeks    Status Achieved      OT LONG TERM GOAL #3   Title Pt to demo Rt forearm and wrist ROM WFL's    Time 10    Period Weeks    Status Achieved      OT LONG TERM GOAL #4   Title Pt to demo 30 lbs or greater grip strength Rt hand to open jars/containers    Time 10    Period Weeks    Status Not Met   26 lbs                Plan - 02/10/21 1446    Clinical Impression Statement Pt has met all STG's and 3/4 LTG's. Pt doing well and progressing with strength. Pt encouraged to continue HEP until full strength has returned    OT Occupational Profile and History Problem Focused Assessment - Including review of records relating to presenting problem    Occupational performance deficits (Please refer to evaluation for details): ADL's;IADL's;Work    Body Structure / Function / Physical Skills ADL;Strength;Dexterity;Pain;Edema;UE functional use;IADL;Coordination;Sensation    Rehab Potential Good    Clinical Decision Making Several treatment options, min-mod task modification necessary    Comorbidities Affecting Occupational Performance: None    Modification or Assistance to Complete Evaluation  Min-Moderate modification of tasks or assist with assess necessary to complete eval    OT Frequency 1x / week   (due to self pay)   OT Duration --   10 weeks   OT Treatment/Interventions Self-care/ADL training;Moist Heat;Fluidtherapy;DME and/or AE instruction;Splinting;Therapeutic activities;Compression bandaging;Contrast Bath;Ultrasound;Therapeutic exercise;Scar mobilization;Cryotherapy;Passive range of motion;Paraffin;Electrical Stimulation;Manual Therapy;Patient/family education    Plan D/C O.T. (pt was offered 1 more session but decided she could wrap up today d/t progress and upcoming  vacation)    Consulted and Agree with Plan of Care Patient;Family member/caregiver    Family Member Consulted daughter n law           Patient will benefit from  skilled therapeutic intervention in order to improve the following deficits and impairments:   Body Structure / Function / Physical Skills: ADL,Strength,Dexterity,Pain,Edema,UE functional use,IADL,Coordination,Sensation       Visit Diagnosis: Stiffness of right wrist, not elsewhere classified  Stiffness of joint of right forearm  Muscle weakness (generalized)    Problem List There are no problems to display for this patient.  OCCUPATIONAL THERAPY DISCHARGE SUMMARY  Visits from Start of Care: 8  Current functional level related to goals / functional outcomes: See above   Remaining deficits: Pain (worse in the am) Strength   Education / Equipment: HEP's, splint wear and care, scar management   Plan: Patient agrees to discharge.  Patient goals were met. Patient is being discharged due to being pleased with the current functional level.  ?????        Carey Bullocks, OTR/L 02/10/2021, 2:50 PM  [facility] [address] [place], [place], [address] Phone: [phone]   Fax:  [phone]  Name: Ebony Davis MRN: [id number] Date of Birth: 06-19-1956

## 2021-02-10 NOTE — Patient Instructions (Signed)
Pronation / Supination (Resistive)    Hold hammer weighing __8__ ounces (or hold 2-3 lb dumbbell at one end) and rotate palm up and down. Keep elbow bent at side and wrist straight. Repeat __10__ times. Do __2__ sessions per day.

## 2022-08-21 IMAGING — CR DG WRIST COMPLETE 3+V*R*
4 series · 4 of 4 positions shown · non-contrast
Comparison: Films from earlier in the same day.

CLINICAL DATA: Status post reduction

EXAM:
RIGHT WRIST - COMPLETE 3+ VIEW

[x wrist pa right]
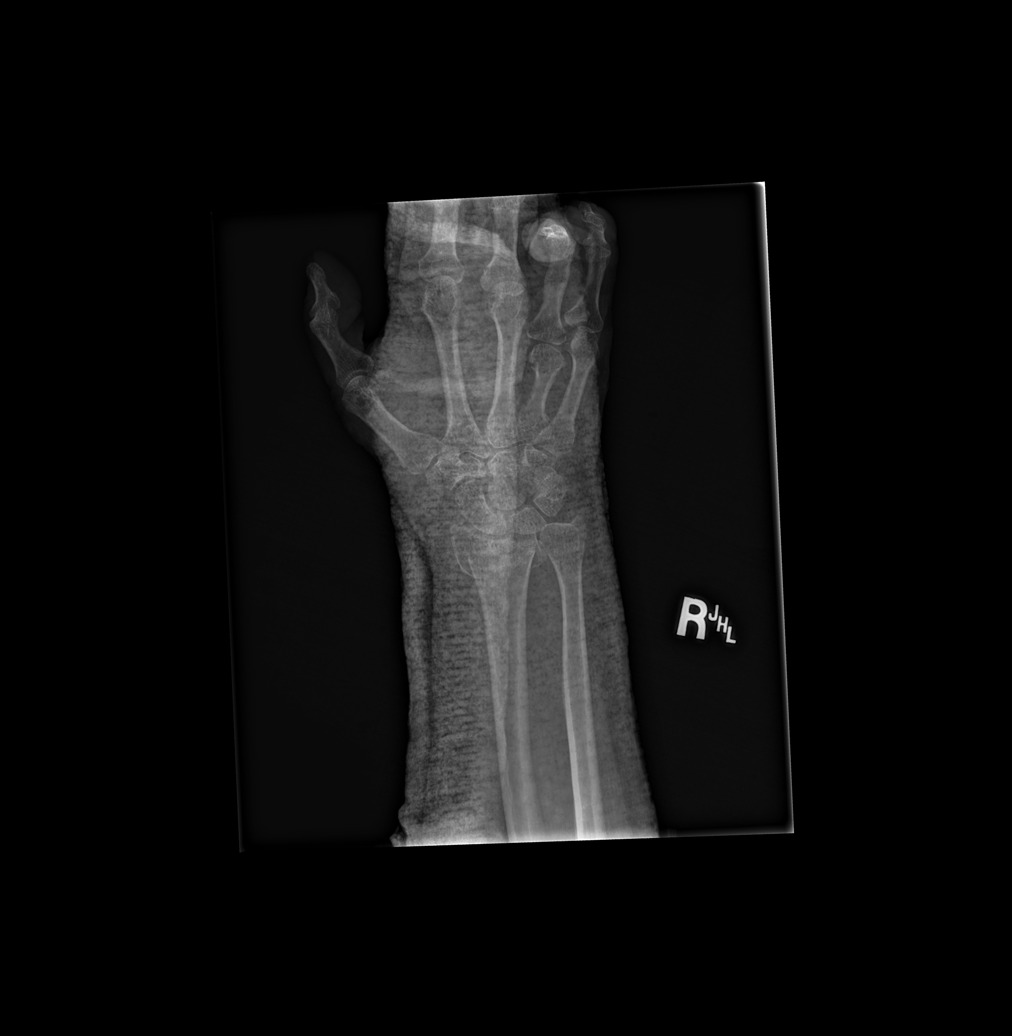

[x wrist obl right]
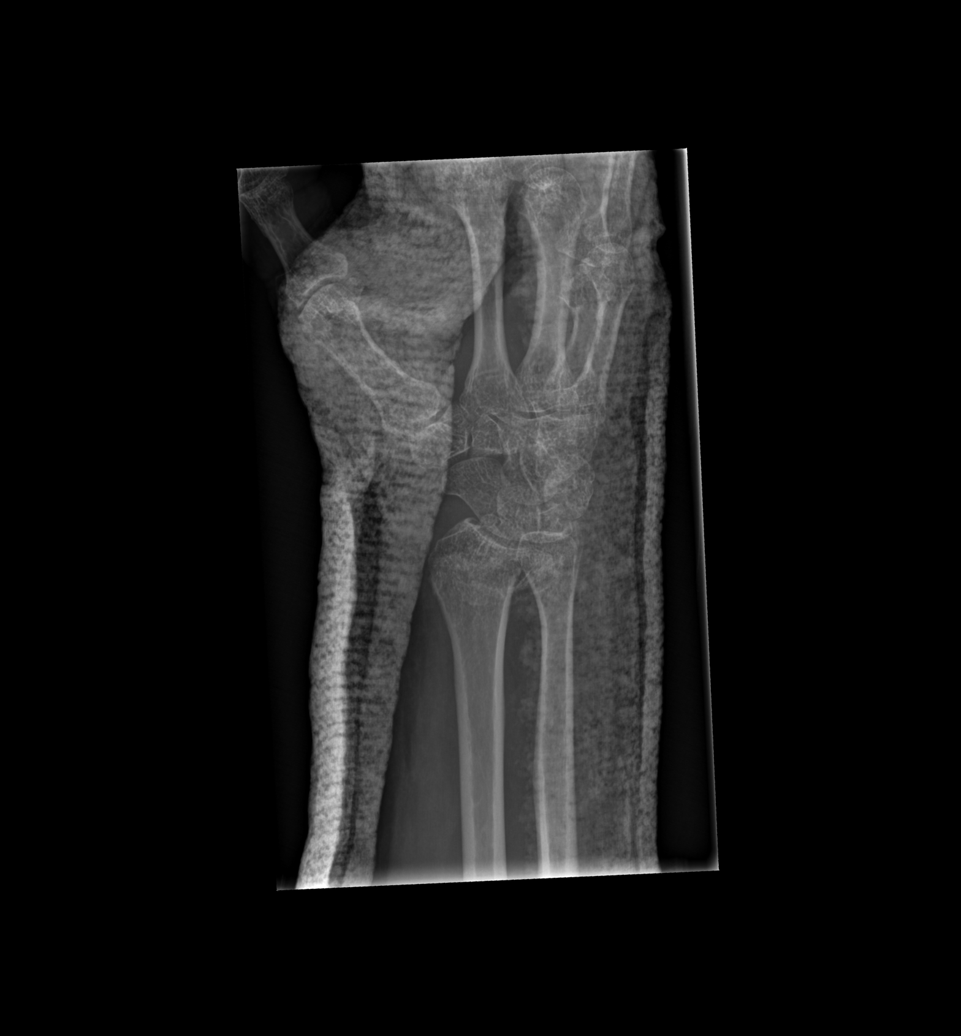

[x wrist lat right]
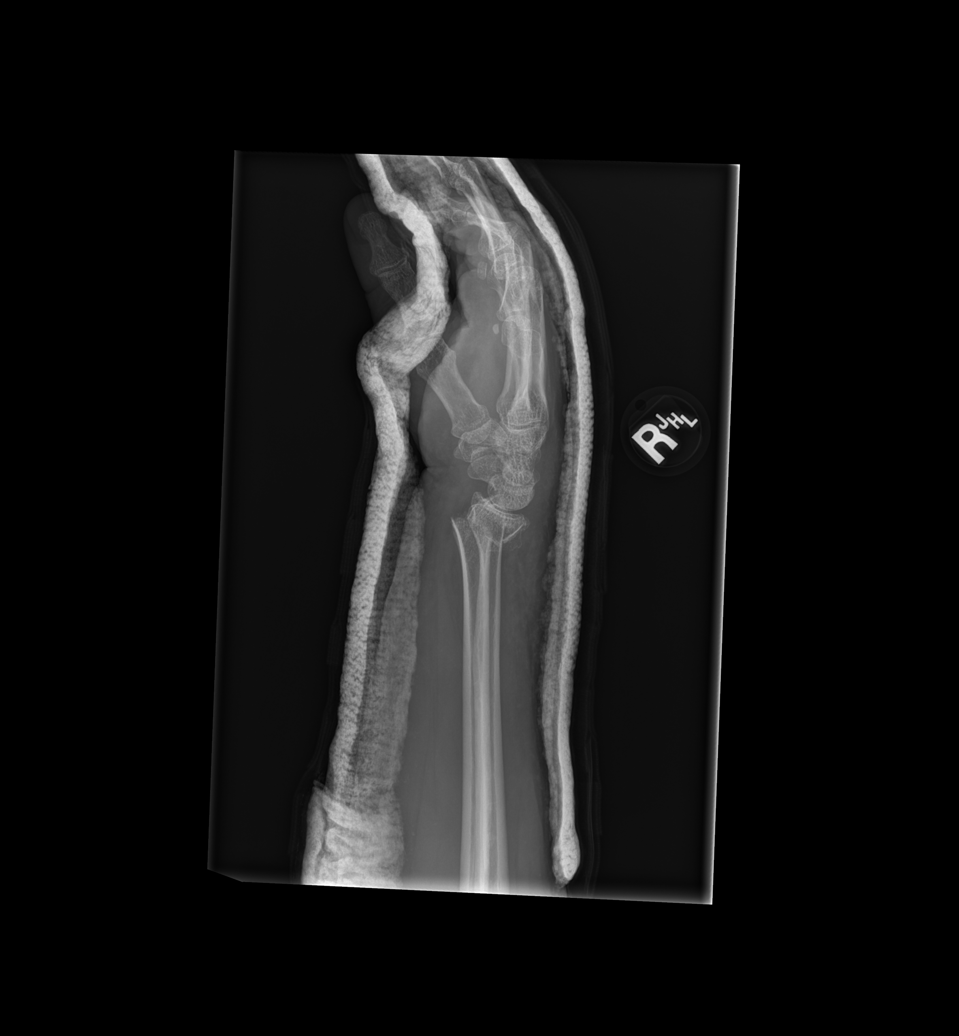

[x wrist navicular view right]
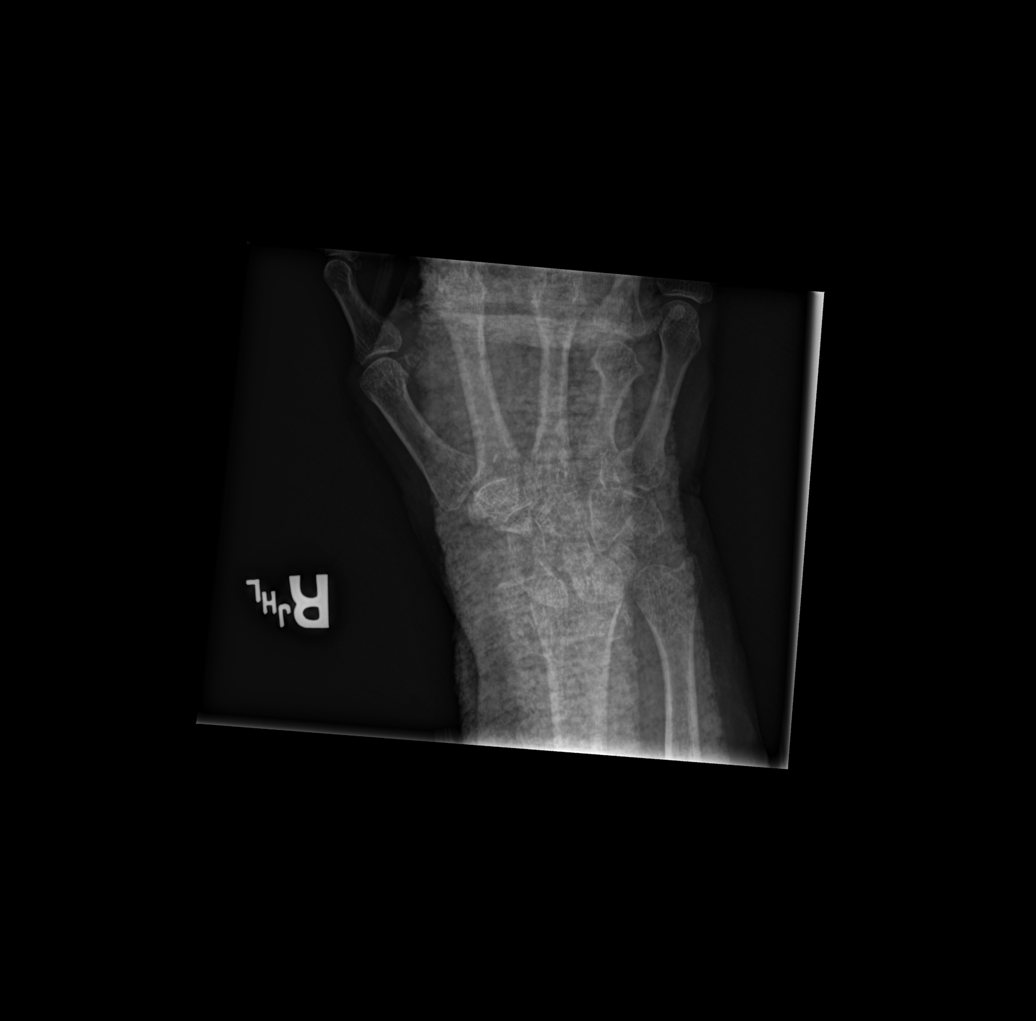

[4 of 4 positions shown; findings below may reference images not displayed]

FINDINGS: Splinting material is now in place. The fracture has been reduced
somewhat although some posterior displacement of the distal radial
fracture fragment remains. No new fracture is identified.
IMPRESSION: Slight reduction of the distal radial fracture.
